# Patient Record
Sex: Female | Born: 1935 | Race: White | Hispanic: No | Smoking: Never smoker
Health system: Southern US, Community
[De-identification: ages and names within clinical notes are randomized; demographics above are authoritative.]

## PROBLEM LIST (undated history)

## (undated) DIAGNOSIS — E039 Hypothyroidism, unspecified: Secondary | ICD-10-CM

## (undated) DIAGNOSIS — K219 Gastro-esophageal reflux disease without esophagitis: Secondary | ICD-10-CM

## (undated) DIAGNOSIS — S32000A Wedge compression fracture of unspecified lumbar vertebra, initial encounter for closed fracture: Secondary | ICD-10-CM

## (undated) DIAGNOSIS — M545 Low back pain, unspecified: Secondary | ICD-10-CM

## (undated) DIAGNOSIS — N183 Chronic kidney disease, stage 3 unspecified: Secondary | ICD-10-CM

## (undated) DIAGNOSIS — G8929 Other chronic pain: Secondary | ICD-10-CM

## (undated) DIAGNOSIS — F419 Anxiety disorder, unspecified: Secondary | ICD-10-CM

## (undated) DIAGNOSIS — Z950 Presence of cardiac pacemaker: Secondary | ICD-10-CM

## (undated) DIAGNOSIS — M1990 Unspecified osteoarthritis, unspecified site: Secondary | ICD-10-CM

## (undated) DIAGNOSIS — F32A Depression, unspecified: Secondary | ICD-10-CM

## (undated) DIAGNOSIS — Z8489 Family history of other specified conditions: Secondary | ICD-10-CM

## (undated) DIAGNOSIS — E079 Disorder of thyroid, unspecified: Secondary | ICD-10-CM

## (undated) DIAGNOSIS — C4491 Basal cell carcinoma of skin, unspecified: Secondary | ICD-10-CM

## (undated) DIAGNOSIS — I4891 Unspecified atrial fibrillation: Secondary | ICD-10-CM

## (undated) DIAGNOSIS — H3530 Unspecified macular degeneration: Secondary | ICD-10-CM

## (undated) DIAGNOSIS — Z9289 Personal history of other medical treatment: Secondary | ICD-10-CM

## (undated) DIAGNOSIS — M810 Age-related osteoporosis without current pathological fracture: Secondary | ICD-10-CM

## (undated) DIAGNOSIS — F329 Major depressive disorder, single episode, unspecified: Secondary | ICD-10-CM

## (undated) DIAGNOSIS — I10 Essential (primary) hypertension: Secondary | ICD-10-CM

## (undated) HISTORY — PX: CARDIAC CATHETERIZATION: SHX172

## (undated) HISTORY — PX: BASAL CELL CARCINOMA EXCISION: SHX1214

## (undated) HISTORY — PX: INSERT / REPLACE / REMOVE PACEMAKER: SUR710

## (undated) HISTORY — PX: ABDOMINAL EXPLORATION SURGERY: SHX538

## (undated) HISTORY — PX: PACEMAKER REMOVAL: SHX5066

## (undated) HISTORY — PX: CATARACT EXTRACTION W/ INTRAOCULAR LENS  IMPLANT, BILATERAL: SHX1307

## (undated) HISTORY — PX: ATRIAL ABLATION SURGERY: SHX560

## (undated) HISTORY — PX: APPENDECTOMY: SHX54

---

## 1978-08-06 HISTORY — PX: ABDOMINAL HYSTERECTOMY: SHX81

## 2012-04-09 ENCOUNTER — Emergency Department (HOSPITAL_BASED_OUTPATIENT_CLINIC_OR_DEPARTMENT_OTHER)
Admission: EM | Admit: 2012-04-09 | Discharge: 2012-04-09 | Disposition: A | Discharge status: Home / Self Care | Payer: Medicare Other | Attending: Emergency Medicine | Admitting: Emergency Medicine

## 2012-04-09 ENCOUNTER — Emergency Department (HOSPITAL_BASED_OUTPATIENT_CLINIC_OR_DEPARTMENT_OTHER): Payer: Medicare Other

## 2012-04-09 ENCOUNTER — Encounter (HOSPITAL_BASED_OUTPATIENT_CLINIC_OR_DEPARTMENT_OTHER): Payer: Self-pay | Admitting: Family Medicine

## 2012-04-09 DIAGNOSIS — M545 Low back pain, unspecified: Secondary | ICD-10-CM | POA: Insufficient documentation

## 2012-04-09 DIAGNOSIS — I10 Essential (primary) hypertension: Secondary | ICD-10-CM | POA: Insufficient documentation

## 2012-04-09 DIAGNOSIS — E039 Hypothyroidism, unspecified: Secondary | ICD-10-CM | POA: Insufficient documentation

## 2012-04-09 DIAGNOSIS — M25559 Pain in unspecified hip: Secondary | ICD-10-CM | POA: Insufficient documentation

## 2012-04-09 DIAGNOSIS — K7689 Other specified diseases of liver: Secondary | ICD-10-CM | POA: Insufficient documentation

## 2012-04-09 DIAGNOSIS — M899 Disorder of bone, unspecified: Secondary | ICD-10-CM | POA: Insufficient documentation

## 2012-04-09 DIAGNOSIS — X500XXA Overexertion from strenuous movement or load, initial encounter: Secondary | ICD-10-CM | POA: Insufficient documentation

## 2012-04-09 DIAGNOSIS — S32009A Unspecified fracture of unspecified lumbar vertebra, initial encounter for closed fracture: Secondary | ICD-10-CM | POA: Insufficient documentation

## 2012-04-09 DIAGNOSIS — S32000A Wedge compression fracture of unspecified lumbar vertebra, initial encounter for closed fracture: Secondary | ICD-10-CM

## 2012-04-09 HISTORY — DX: Disorder of thyroid, unspecified: E07.9

## 2012-04-09 HISTORY — DX: Essential (primary) hypertension: I10

## 2012-04-09 MED ORDER — ONDANSETRON 4 MG PO TBDP
ORAL_TABLET | ORAL | Status: AC
  Filled 2012-04-09: qty 1

## 2012-04-09 MED ORDER — OXYCODONE-ACETAMINOPHEN 5-325 MG PO TABS: 2.0000 | ORAL_TABLET | ORAL | Status: AC | PRN

## 2012-04-09 MED ORDER — ONDANSETRON 4 MG PO TBDP
4.0000 mg | ORAL_TABLET | Freq: Once | ORAL | Status: AC
  Administered 2012-04-09: 4 mg via ORAL

## 2012-04-09 MED ORDER — HYDROCODONE-ACETAMINOPHEN 5-325 MG PO TABS
1.0000 | ORAL_TABLET | Freq: Once | ORAL | Status: AC
  Administered 2012-04-09: 1 via ORAL
  Filled 2012-04-09: qty 1

## 2012-04-09 NOTE — ED Provider Notes (Signed)
History     CSN: [id number]  Arrival date & time 04/09/12  1043   First MD Initiated Contact with Patient 04/09/12 1126      Chief Complaint  Patient presents with  . Back Pain    (Consider location/radiation/quality/duration/timing/severity/associated sxs/prior treatment) HPI Comments: Patient presents with low back pain that started after she was putting a watermelon interior refrigerator to swelling. She had lower herself to the floor on her knees as her back started hurting. The pain is across her low back and radiating down to her bilateral hips. She reports no previous history of back problems. Denies any weakness, numbness or tingling. No fever, vomiting, bowel or bladder incontinence. She is able to ambulate. She denies abdominal pain, chest pain or shortness of breath. He took ibuprofen without relief.  The history is provided by the patient and a relative.    Past Medical History  Diagnosis Date  . Hypertension   . Thyroid disease     Past Surgical History  Procedure Date  . Pacemaker insertion   . Pacemaker removal   . Atrial ablation surgery   . Eye surgery   . Cataract extraction     No family history on file.  History  Substance Use Topics  . Smoking status: Never Smoker   . Smokeless tobacco: Not on file  . Alcohol Use: No    OB History    Grav Para Term Preterm Abortions TAB SAB Ect Mult Living                  Review of Systems  Constitutional: Negative for fever, activity change and appetite change.  HENT: Negative for congestion and rhinorrhea.   Respiratory: Negative for cough, chest tightness and shortness of breath.   Cardiovascular: Negative for chest pain.  Gastrointestinal: Negative for nausea, vomiting and abdominal pain.  Genitourinary: Negative for dysuria and hematuria.  Musculoskeletal: Positive for back pain. Negative for gait problem.  Skin: Negative for rash.  Neurological: Negative for dizziness, weakness and headaches.     Allergies  Adhesive  Home Medications   Current Outpatient Rx  Name Route Sig Dispense Refill  . AMIODARONE HCL PO Oral Take 100 mg by mouth.    . AMLODIPINE BESYLATE PO Oral Take by mouth.    Marland Kitchen GABAPENTIN 100 MG PO CAPS Oral Take 200 mg by mouth at bedtime.    Marland Kitchen HYDROCHLOROTHIAZIDE 25 MG PO TABS Oral Take by mouth daily. Unknown dose    . LEVOTHYROXINE SODIUM 50 MCG PO TABS Oral Take 50 mcg by mouth daily.      BP 139/82  Pulse 63  Temp 98.3 F (36.8 C) (Oral)  Resp 16  Ht 5' 8.5" (1.74 m)  Wt 148 lb (67.132 kg)  BMI 22.18 kg/m2  SpO2 96%  Physical Exam  Constitutional: She is oriented to person, place, and time. She appears well-developed and well-nourished. No distress.  HENT:  Head: Normocephalic and atraumatic.  Mouth/Throat: Oropharynx is clear and moist. No oropharyngeal exudate.  Eyes: Conjunctivae and EOM are normal.  Neck: Normal range of motion. Neck supple.  Cardiovascular: Normal rate, regular rhythm and normal heart sounds.   No murmur heard. Pulmonary/Chest: Effort normal and breath sounds normal. No respiratory distress.  Abdominal: Soft. There is no tenderness. There is no rebound and no guarding.  Musculoskeletal: Normal range of motion. She exhibits no edema and no tenderness.       Diffuse paraspinal lumbar tenderness. No midline tenderness. Full range of motion  of the hips without pain. SLR test negative   Neurological: She is alert and oriented to person, place, and time. No cranial nerve deficit.       5/5 strength in the lower extremities, great toe extension intact. +2 DP and PT pulses.  Unable to illicit patellar reflexes bilaterally.  Skin: Skin is warm.    ED Course  Procedures (including critical care time)  Labs Reviewed - No data to display Dg Lumbar Spine Complete  04/09/2012  *RADIOLOGY REPORT*  Clinical Data: Injured bending  LUMBAR SPINE - COMPLETE 4+ VIEW  Comparison: None.  Findings: The bones are diffusely osteopenic.  There  is a partial compression deformity of the superior endplate of L1 vertebral body of 25-30%.  No retropulsion is seen.  The remainder of intervertebral disc spaces appear normal.  The SI joints are corticated.  Probable renovascular calcification is noted on the right.  IMPRESSION:  1.  Mild compression deformity of the anterior superior aspect of L1 vertebral body. 2.  Diffuse osteopenia.   Original Report Authenticated By: Juline Patch, M.D.    Dg Pelvis 1-2 Views  04/09/2012  *RADIOLOGY REPORT*  Clinical Data: Injured bending  PELVIS - 1-2 VIEW  Comparison: None.  Findings: Both hips are normal position.  The pelvic rami appear normal.  The SI joints appear corticated.  IMPRESSION: Negative.   Original Report Authenticated By: Juline Patch, M.D.    Ct Lumbar Spine Wo Contrast  04/09/2012  *RADIOLOGY REPORT*  Clinical Data: Injury today with low back and bilateral hip pain. L1 compression fracture by plain films.  CT LUMBAR SPINE WITHOUT CONTRAST  Technique:  Multidetector CT imaging of the lumbar spine was performed without intravenous contrast administration. Multiplanar CT image reconstructions were also generated.  Comparison: Plain films lumbar spine 04/09/2012 at 11:56 a.m.  Findings: As seen on the comparison plain films, the patient has a superior endplate compression fracture L1 with approximately 25% vertebral body height loss and slight bony retropulsion.  The fracture margins appear sharp suggesting acute injury.  There is no extension into the posterior elements.  Vertebral body height is otherwise maintained.  Imaged paraspinous structures demonstrate low attenuation lesions in the liver which cannot be fully characterized.  Some of these are consistent with cysts but one in the posterior right hepatic lobe has Hounsfield unit measurements of approximately 35.  There appear to be bilateral parapelvic renal cysts.  Imaged intra- abdominal contents also demonstrate scattered atherosclerosis.   T11-12:  Facet degenerative disease. Otherwise negative.  T12-L1:  Mild bony retropulsion without central canal or foraminal stenosis.  There is some facet arthropathy.  L1-2:  Negative.  L2-3:  Mild disc bulge and facet degenerative disease without central canal or foraminal stenosis.  L3-4:  Disc bulge and ligamentum flavum thickening cause mild central canal narrowing.  Foramina appear open.  L4-5:  Disc bulge and ligamentum flavum thickening cause mild central canal narrowing.  Foramina are open.  There is facet arthropathy.  L5-S1:  Mild disc bulge and ligament flavum thickening without central canal or foraminal stenosis.  IMPRESSION:  1.  Mild superior endplate compression fracture of L1 with minimal bony retropulsion and no extension into the posterior elements appears acute.  Although the fracture cannot be definitively characterized, its appearance is most in keeping with a senile osteoporotic injury. 2.  Low attenuation lesions in the liver are likely cysts but cannot be definitively characterized.  MRI of the abdomen with and without contrast is recommended for  further evaluation. 3.  Bilateral renal cysts.   Original Report Authenticated By: Bernadene Bell. Maricela Curet, M.D.    US Aorta  04/09/2012  *RADIOLOGY REPORT*  Clinical Data:  Back pain.  ULTRASOUND OF ABDOMINAL AORTA  Technique:  Ultrasound examination of the abdominal aorta was performed to evaluate for abdominal aortic aneurysm.  Comparison: None  Abdominal Aorta:  No aneurysm identified.        Maximum AP diameter:  2.9 cm.       Maximum TRV diameter:  2.7 cm.  Right common iliac artery:  Maximum diameter:  1.0 cm. Left common iliac artery:  Maximum diameter:  1.1 cm.  IMPRESSION: No abdominal aortic aneurysm identified.   Original Report Authenticated By: P. Loralie Champagne, M.D.      No diagnosis found.    MDM  Low back pain and hip pain after putting watermelon in refrigerator. No neurological deficits.  Imaging remarkable for  apparent acute L1 compression fracture. Aorta appears normal. Patient ambulatory in the ED without assistance. No focal neurological deficits.  Patient believes her pain is well-controlled and wishes to go home. Treat pain followup with orthopedics. She is made aware of the liver cysts and recommendations for MRI for her PCP.      Glynn Octave, MD 04/09/12 [phone]

## 2012-04-09 NOTE — ED Notes (Signed)
Pt sts she was putting a heavy watermelon into refrigerator at home and low back and hips started hurting and she "lowered" herself to the floor. Pt able to ambulate with pain. Pt took ibuprofen at home.

## 2012-04-09 NOTE — ED Notes (Signed)
Pt with slow steady gait and RN assist to car-pt given xray copies on CD due to plan to f/u with [facility]

## 2015-12-10 ENCOUNTER — Encounter (HOSPITAL_BASED_OUTPATIENT_CLINIC_OR_DEPARTMENT_OTHER): Payer: Self-pay | Admitting: Emergency Medicine

## 2015-12-10 ENCOUNTER — Emergency Department (HOSPITAL_BASED_OUTPATIENT_CLINIC_OR_DEPARTMENT_OTHER)
Admission: EM | Admit: 2015-12-10 | Discharge: 2015-12-10 | Disposition: A | Discharge status: Home / Self Care | Payer: Medicare Other | Attending: Emergency Medicine | Admitting: Emergency Medicine

## 2015-12-10 ENCOUNTER — Emergency Department (HOSPITAL_BASED_OUTPATIENT_CLINIC_OR_DEPARTMENT_OTHER): Payer: Medicare Other

## 2015-12-10 DIAGNOSIS — I10 Essential (primary) hypertension: Secondary | ICD-10-CM | POA: Insufficient documentation

## 2015-12-10 DIAGNOSIS — W228XXA Striking against or struck by other objects, initial encounter: Secondary | ICD-10-CM | POA: Diagnosis not present

## 2015-12-10 DIAGNOSIS — Y939 Activity, unspecified: Secondary | ICD-10-CM | POA: Insufficient documentation

## 2015-12-10 DIAGNOSIS — E079 Disorder of thyroid, unspecified: Secondary | ICD-10-CM | POA: Diagnosis not present

## 2015-12-10 DIAGNOSIS — S20219A Contusion of unspecified front wall of thorax, initial encounter: Secondary | ICD-10-CM

## 2015-12-10 DIAGNOSIS — Y929 Unspecified place or not applicable: Secondary | ICD-10-CM | POA: Diagnosis not present

## 2015-12-10 DIAGNOSIS — Y999 Unspecified external cause status: Secondary | ICD-10-CM | POA: Insufficient documentation

## 2015-12-10 DIAGNOSIS — S2020XA Contusion of thorax, unspecified, initial encounter: Secondary | ICD-10-CM | POA: Insufficient documentation

## 2015-12-10 DIAGNOSIS — S299XXA Unspecified injury of thorax, initial encounter: Secondary | ICD-10-CM | POA: Diagnosis present

## 2015-12-10 NOTE — ED Notes (Signed)
Pt alert, NAD, calm, interactive, resps e/u, speaking in clear complete sentences, no dyspnea noted, family at [facility], c/o mid chest wall pain where shower door hit her, no obvious markings, skin intact, (denies: sob, nv, dizziness or other sx), son at [facility], pt to xray.

## 2015-12-10 NOTE — ED Provider Notes (Signed)
CSN: [id number]     Arrival date & time 12/10/15  1937 History  By signing my name below, I, [facility], attest that this documentation has been prepared under the direction and in the presence of Tanna Furry, MD. Electronically Signed: Virgel Bouquet, ED Scribe. 12/10/2015. 8:42 PM.   Chief Complaint  Patient presents with  . Chest Injury   The history is provided by the patient. No language interpreter was used.   HPI Comments: Deanna Gonzales is a 82 y.o. female with an hx of HTN, thyroid disease, and osteoporosis who presents to the Emergency Department complaining of constant, mild, gradually worsening chest wall pain after an injury that occurred 2 days ago. Pt states that she was standing outside of her glass and metal shower door when it came off the track and fell forward striking her in the forehead and central chest, followed immediately by pain. She reports mild dyspnea and contusion to her forehead. She has taken hydrocodone which provided some relief but no relief today. Per husband and pt, she was prescribed hydrocodone after she was diagnosed with pinched nerves in her shoulder. She takes coumadin and HTN medications regularly. Denies LOC, ecchymosis, falls, or any other symptoms currently.  Past Medical History  Diagnosis Date  . Hypertension   . Thyroid disease    Past Surgical History  Procedure Laterality Date  . Pacemaker insertion    . Pacemaker removal    . Atrial ablation surgery    . Eye surgery    . Cataract extraction     History reviewed. No pertinent family history. Social History  Substance Use Topics  . Smoking status: Never Smoker   . Smokeless tobacco: None  . Alcohol Use: No   OB History    No data available     Review of Systems  Constitutional: Negative for fever, chills, diaphoresis, appetite change and fatigue.  HENT: Negative for mouth sores, sore throat and trouble swallowing.   Eyes: Negative for visual disturbance.   Respiratory: Negative for cough, chest tightness, shortness of breath and wheezing.   Cardiovascular: Negative for chest pain.  Gastrointestinal: Negative for nausea, vomiting, abdominal pain, diarrhea and abdominal distention.  Endocrine: Negative for polydipsia, polyphagia and polyuria.  Genitourinary: Negative for dysuria, frequency and hematuria.  Musculoskeletal: Positive for myalgias (chest wall (central)). Negative for gait problem.  Skin: Positive for wound (contusion). Negative for color change, pallor and rash.  Neurological: Negative for dizziness, light-headedness and headaches.  Hematological: Does not bruise/bleed easily.  Psychiatric/Behavioral: Negative for behavioral problems and confusion.    Allergies  Adhesive  Home Medications   Prior to Admission medications   Medication Sig Start Date End Date Taking? Authorizing Provider  AMIODARONE HCL PO Take 100 mg by mouth.   Yes Historical Provider, MD  AMLODIPINE BESYLATE PO Take by mouth.   Yes Historical Provider, MD  gabapentin (NEURONTIN) 100 MG capsule Take 200 mg by mouth at bedtime.   Yes Historical Provider, MD  hydrochlorothiazide (HYDRODIURIL) 25 MG tablet Take by mouth daily. Unknown dose   Yes Historical Provider, MD  levothyroxine (SYNTHROID, LEVOTHROID) 50 MCG tablet Take 50 mcg by mouth daily.   Yes Historical Provider, MD   BP 117/56 mmHg  Pulse 59  Temp(Src) 97.6 F (36.4 C) (Oral)  Resp 20  Ht 5\' 8"  (1.727 m)  Wt 154 lb (69.854 kg)  BMI 23.42 kg/m2  SpO2 94% Physical Exam  Constitutional: She is oriented to person, place, and time. She appears well-developed and  well-nourished. No distress.  HENT:  Head: Normocephalic. Head is with contusion.  Evolving contusion on the right forehead.   Eyes: Conjunctivae are normal. Pupils are equal, round, and reactive to light. No scleral icterus.  Neck: Normal range of motion. Neck supple. No thyromegaly present.  Cardiovascular: Normal rate and regular  rhythm.  Exam reveals no gallop and no friction rub.   No murmur heard. Pulmonary/Chest: Effort normal and breath sounds normal. No respiratory distress. She has no wheezes. She has no rales. She exhibits tenderness. She exhibits no crepitus.  Tenderness over the mid-sternum. No crepitus  Abdominal: Soft. Bowel sounds are normal. She exhibits no distension. There is no tenderness. There is no rebound.  Musculoskeletal: Normal range of motion.  Neurological: She is alert and oriented to person, place, and time.  Skin: Skin is warm and dry. No rash noted.  Psychiatric: She has a normal mood and affect. Her behavior is normal.    ED Course  Procedures   DIAGNOSTIC STUDIES: Oxygen Saturation is 94% on RA, adequate by my interpretation.    COORDINATION OF CARE: 8:03 PM Will order chest x-ray. Discussed treatment plan with pt at bedside and pt agreed to plan.  8:41 PM Returned to discuss chest imaging results. Will discharge home.  Imaging Review Dg Chest 2 View  12/10/2015  CLINICAL DATA:  82 year old female with chest pain following injury 2 days ago. EXAM: CHEST  2 VIEW COMPARISON:  04/01/2015 and prior radiographs FINDINGS: Cardiomegaly and right-sided pacemaker again noted. Subsegmental atelectasis/scarring within the right lower lobe identified. There is no evidence of focal airspace disease, pulmonary edema, suspicious pulmonary nodule/mass, pleural effusion, or pneumothorax. No acute bony abnormalities are identified. IMPRESSION: Right lower lobe subsegmental atelectasis/scarring. Cardiomegaly. Electronically Signed   By: Margarette Canada M.D.   On: 12/10/2015 20:31   I have personally reviewed and evaluated these images as part of my medical decision-making.   MDM   Final diagnoses:  Chest wall contusion, unspecified laterality, initial encounter    Normal exam other than TTP at sternum.  Clear lungs. Normal cxr.   Tanna Furry, MD 12/10/15 2044

## 2015-12-10 NOTE — ED Notes (Signed)
Dr.James into room prior to RN assessment, see MD notes, pending orders.

## 2015-12-10 NOTE — ED Notes (Signed)
PER PT REPORT WAS HIT IN THE CHEST WITH SHOWER DOOR ON Thursday,increase in chest pain.

## 2015-12-10 NOTE — ED Notes (Signed)
EDP in to room at time of d/c

## 2015-12-10 NOTE — Discharge Instructions (Signed)
Usual hydrocodone, OR Tylenol every 4 hours as needed for pain.   Contusion A contusion is a deep bruise. Contusions happen when an injury causes bleeding under the skin. Symptoms of bruising include pain, swelling, and discolored skin. The skin may turn blue, purple, or yellow. HOME CARE   Rest the injured area.  If told, put ice on the injured area.  Put ice in a plastic bag.  Place a towel between your skin and the bag.  Leave the ice on for 20 minutes, 2-3 times per day.  If told, put light pressure (compression) on the injured area using an elastic bandage. Make sure the bandage is not too tight. Remove it and put it back on as told by your doctor.  If possible, raise (elevate) the injured area above the level of your heart while you are sitting or lying down.  Take over-the-counter and prescription medicines only as told by your doctor. GET HELP IF:  Your symptoms do not get better after several days of treatment.  Your symptoms get worse.  You have trouble moving the injured area. GET HELP RIGHT AWAY IF:   You have very bad pain.  You have a loss of feeling (numbness) in a hand or foot.  Your hand or foot turns pale or cold.   This information is not intended to replace advice given to you by your health care provider. Make sure you discuss any questions you have with your health care provider.   Document Released: 01/09/2008 Document Revised: 04/13/2015 Document Reviewed: 12/08/2014 Elsevier Interactive Patient Education [employer].

## 2017-09-06 DIAGNOSIS — S32000A Wedge compression fracture of unspecified lumbar vertebra, initial encounter for closed fracture: Secondary | ICD-10-CM

## 2017-09-06 HISTORY — DX: Wedge compression fracture of unspecified lumbar vertebra, initial encounter for closed fracture: S32.000A

## 2017-09-12 ENCOUNTER — Other Ambulatory Visit: Payer: Self-pay | Admitting: Family Medicine

## 2017-09-12 ENCOUNTER — Ambulatory Visit
Admission: RE | Admit: 2017-09-12 | Discharge: 2017-09-12 | Disposition: A | Discharge status: Home / Self Care | Payer: Medicare Other | Source: Ambulatory Visit | Attending: Family Medicine | Admitting: Family Medicine

## 2017-09-12 DIAGNOSIS — M545 Low back pain, unspecified: Secondary | ICD-10-CM

## 2017-09-15 ENCOUNTER — Other Ambulatory Visit: Payer: Self-pay | Admitting: Family Medicine

## 2017-09-15 DIAGNOSIS — M549 Dorsalgia, unspecified: Secondary | ICD-10-CM

## 2017-09-18 ENCOUNTER — Inpatient Hospital Stay (HOSPITAL_COMMUNITY)
Admission: EM | Admit: 2017-09-18 | Discharge: 2017-09-26 | DRG: 517 | Disposition: A | Discharge status: Skilled Nursing Facility | Payer: Medicare Other | Attending: Family Medicine | Admitting: Family Medicine

## 2017-09-18 ENCOUNTER — Encounter (HOSPITAL_COMMUNITY): Payer: Self-pay | Admitting: Emergency Medicine

## 2017-09-18 ENCOUNTER — Other Ambulatory Visit: Payer: Self-pay

## 2017-09-18 DIAGNOSIS — M4856XA Collapsed vertebra, not elsewhere classified, lumbar region, initial encounter for fracture: Secondary | ICD-10-CM | POA: Diagnosis present

## 2017-09-18 DIAGNOSIS — Z950 Presence of cardiac pacemaker: Secondary | ICD-10-CM

## 2017-09-18 DIAGNOSIS — M545 Low back pain, unspecified: Secondary | ICD-10-CM

## 2017-09-18 DIAGNOSIS — E039 Hypothyroidism, unspecified: Secondary | ICD-10-CM | POA: Diagnosis present

## 2017-09-18 DIAGNOSIS — Z91048 Other nonmedicinal substance allergy status: Secondary | ICD-10-CM

## 2017-09-18 DIAGNOSIS — Y92230 Patient room in hospital as the place of occurrence of the external cause: Secondary | ICD-10-CM | POA: Diagnosis not present

## 2017-09-18 DIAGNOSIS — Z7901 Long term (current) use of anticoagulants: Secondary | ICD-10-CM | POA: Diagnosis not present

## 2017-09-18 DIAGNOSIS — I447 Left bundle-branch block, unspecified: Secondary | ICD-10-CM | POA: Diagnosis present

## 2017-09-18 DIAGNOSIS — I4891 Unspecified atrial fibrillation: Secondary | ICD-10-CM

## 2017-09-18 DIAGNOSIS — R5381 Other malaise: Secondary | ICD-10-CM | POA: Diagnosis present

## 2017-09-18 DIAGNOSIS — Z79899 Other long term (current) drug therapy: Secondary | ICD-10-CM

## 2017-09-18 DIAGNOSIS — R159 Full incontinence of feces: Secondary | ICD-10-CM | POA: Diagnosis present

## 2017-09-18 DIAGNOSIS — N183 Chronic kidney disease, stage 3 (moderate): Secondary | ICD-10-CM | POA: Diagnosis present

## 2017-09-18 DIAGNOSIS — H3530 Unspecified macular degeneration: Secondary | ICD-10-CM | POA: Diagnosis present

## 2017-09-18 DIAGNOSIS — I129 Hypertensive chronic kidney disease with stage 1 through stage 4 chronic kidney disease, or unspecified chronic kidney disease: Secondary | ICD-10-CM | POA: Diagnosis present

## 2017-09-18 DIAGNOSIS — Z90710 Acquired absence of both cervix and uterus: Secondary | ICD-10-CM | POA: Diagnosis not present

## 2017-09-18 DIAGNOSIS — Z9849 Cataract extraction status, unspecified eye: Secondary | ICD-10-CM | POA: Diagnosis not present

## 2017-09-18 DIAGNOSIS — M5440 Lumbago with sciatica, unspecified side: Secondary | ICD-10-CM | POA: Diagnosis present

## 2017-09-18 DIAGNOSIS — M62830 Muscle spasm of back: Secondary | ICD-10-CM | POA: Diagnosis present

## 2017-09-18 DIAGNOSIS — Z85828 Personal history of other malignant neoplasm of skin: Secondary | ICD-10-CM

## 2017-09-18 DIAGNOSIS — S32040D Wedge compression fracture of fourth lumbar vertebra, subsequent encounter for fracture with routine healing: Secondary | ICD-10-CM | POA: Diagnosis not present

## 2017-09-18 DIAGNOSIS — K5900 Constipation, unspecified: Secondary | ICD-10-CM | POA: Diagnosis not present

## 2017-09-18 DIAGNOSIS — R112 Nausea with vomiting, unspecified: Secondary | ICD-10-CM | POA: Diagnosis not present

## 2017-09-18 DIAGNOSIS — Z66 Do not resuscitate: Secondary | ICD-10-CM | POA: Diagnosis present

## 2017-09-18 DIAGNOSIS — T50995A Adverse effect of other drugs, medicaments and biological substances, initial encounter: Secondary | ICD-10-CM | POA: Diagnosis not present

## 2017-09-18 DIAGNOSIS — F419 Anxiety disorder, unspecified: Secondary | ICD-10-CM

## 2017-09-18 DIAGNOSIS — Z419 Encounter for procedure for purposes other than remedying health state, unspecified: Secondary | ICD-10-CM

## 2017-09-18 DIAGNOSIS — S32000A Wedge compression fracture of unspecified lumbar vertebra, initial encounter for closed fracture: Secondary | ICD-10-CM | POA: Diagnosis present

## 2017-09-18 DIAGNOSIS — Z79890 Hormone replacement therapy (postmenopausal): Secondary | ICD-10-CM

## 2017-09-18 DIAGNOSIS — D72829 Elevated white blood cell count, unspecified: Secondary | ICD-10-CM | POA: Diagnosis present

## 2017-09-18 DIAGNOSIS — K219 Gastro-esophageal reflux disease without esophagitis: Secondary | ICD-10-CM | POA: Diagnosis present

## 2017-09-18 DIAGNOSIS — F418 Other specified anxiety disorders: Secondary | ICD-10-CM | POA: Diagnosis present

## 2017-09-18 DIAGNOSIS — Z888 Allergy status to other drugs, medicaments and biological substances status: Secondary | ICD-10-CM | POA: Diagnosis not present

## 2017-09-18 DIAGNOSIS — I480 Paroxysmal atrial fibrillation: Secondary | ICD-10-CM | POA: Diagnosis present

## 2017-09-18 HISTORY — DX: Low back pain, unspecified: M54.50

## 2017-09-18 HISTORY — DX: Low back pain: M54.5

## 2017-09-18 HISTORY — DX: Unspecified osteoarthritis, unspecified site: M19.90

## 2017-09-18 HISTORY — DX: Chronic kidney disease, stage 3 (moderate): N18.3

## 2017-09-18 HISTORY — DX: Family history of other specified conditions: Z84.89

## 2017-09-18 HISTORY — DX: Age-related osteoporosis without current pathological fracture: M81.0

## 2017-09-18 HISTORY — DX: Major depressive disorder, single episode, unspecified: F32.9

## 2017-09-18 HISTORY — DX: Basal cell carcinoma of skin, unspecified: C44.91

## 2017-09-18 HISTORY — DX: Personal history of other medical treatment: Z92.89

## 2017-09-18 HISTORY — DX: Other chronic pain: G89.29

## 2017-09-18 HISTORY — DX: Presence of cardiac pacemaker: Z95.0

## 2017-09-18 HISTORY — DX: Unspecified macular degeneration: H35.30

## 2017-09-18 HISTORY — DX: Anxiety disorder, unspecified: F41.9

## 2017-09-18 HISTORY — DX: Chronic kidney disease, stage 3 unspecified: N18.30

## 2017-09-18 HISTORY — DX: Unspecified atrial fibrillation: I48.91

## 2017-09-18 HISTORY — DX: Gastro-esophageal reflux disease without esophagitis: K21.9

## 2017-09-18 HISTORY — DX: Depression, unspecified: F32.A

## 2017-09-18 HISTORY — DX: Hypothyroidism, unspecified: E03.9

## 2017-09-18 HISTORY — DX: Wedge compression fracture of unspecified lumbar vertebra, initial encounter for closed fracture: S32.000A

## 2017-09-18 LAB — CBC WITH DIFFERENTIAL/PLATELET
Basophils Absolute: 0 10*3/uL (ref 0.0–0.1)
Basophils Relative: 0 %
EOS ABS: 0.1 10*3/uL (ref 0.0–0.7)
EOS PCT: 1 %
HCT: 44 % (ref 36.0–46.0)
Hemoglobin: 14.4 g/dL (ref 12.0–15.0)
LYMPHS ABS: 1.7 10*3/uL (ref 0.7–4.0)
LYMPHS PCT: 13 %
MCH: 32.7 pg (ref 26.0–34.0)
MCHC: 32.7 g/dL (ref 30.0–36.0)
MCV: 100 fL (ref 78.0–100.0)
MONO ABS: 1.1 10*3/uL — AB (ref 0.1–1.0)
Monocytes Relative: 9 %
Neutro Abs: 9.7 10*3/uL — ABNORMAL HIGH (ref 1.7–7.7)
Neutrophils Relative %: 77 %
PLATELETS: 309 10*3/uL (ref 150–400)
RBC: 4.4 MIL/uL (ref 3.87–5.11)
RDW: 14.1 % (ref 11.5–15.5)
WBC: 12.5 10*3/uL — AB (ref 4.0–10.5)

## 2017-09-18 LAB — URINALYSIS, ROUTINE W REFLEX MICROSCOPIC
Bilirubin Urine: NEGATIVE
Glucose, UA: NEGATIVE mg/dL
HGB URINE DIPSTICK: NEGATIVE
Ketones, ur: NEGATIVE mg/dL
Leukocytes, UA: NEGATIVE
NITRITE: NEGATIVE
PROTEIN: NEGATIVE mg/dL
SPECIFIC GRAVITY, URINE: 1.005 (ref 1.005–1.030)
pH: 9 — ABNORMAL HIGH (ref 5.0–8.0)

## 2017-09-18 LAB — I-STAT CHEM 8, ED
BUN: 24 mg/dL — ABNORMAL HIGH (ref 6–20)
Calcium, Ion: 1.18 mmol/L (ref 1.15–1.40)
Chloride: 104 mmol/L (ref 101–111)
Creatinine, Ser: 1.2 mg/dL — ABNORMAL HIGH (ref 0.44–1.00)
GLUCOSE: 81 mg/dL (ref 65–99)
HCT: 44 % (ref 36.0–46.0)
Hemoglobin: 15 g/dL (ref 12.0–15.0)
Potassium: 3.9 mmol/L (ref 3.5–5.1)
SODIUM: 143 mmol/L (ref 135–145)
TCO2: 27 mmol/L (ref 22–32)

## 2017-09-18 LAB — PROTIME-INR
INR: 2.33
PROTHROMBIN TIME: 25.4 s — AB (ref 11.4–15.2)

## 2017-09-18 MED ORDER — ONDANSETRON HCL 4 MG PO TABS: 4.0000 mg | ORAL_TABLET | Freq: Four times a day (QID) | ORAL | Status: DC | PRN

## 2017-09-18 MED ORDER — ALPRAZOLAM 0.25 MG PO TABS
0.2500 mg | ORAL_TABLET | Freq: Two times a day (BID) | ORAL | Status: DC | PRN
  Administered 2017-09-19 – 2017-09-21 (×3): 0.25 mg via ORAL
  Filled 2017-09-18 (×3): qty 1

## 2017-09-18 MED ORDER — ONDANSETRON HCL 4 MG/2ML IJ SOLN
4.0000 mg | Freq: Four times a day (QID) | INTRAMUSCULAR | Status: DC | PRN
Start: 2017-09-18 — End: 2017-09-26
  Administered 2017-09-19 – 2017-09-20 (×3): 4 mg via INTRAVENOUS
  Filled 2017-09-18 (×3): qty 2

## 2017-09-18 MED ORDER — ACETAMINOPHEN 325 MG PO TABS: 650.0000 mg | ORAL_TABLET | Freq: Four times a day (QID) | ORAL | Status: DC | PRN

## 2017-09-18 MED ORDER — MIRTAZAPINE 15 MG PO TABS
15.0000 mg | ORAL_TABLET | Freq: Every day | ORAL | Status: DC
  Administered 2017-09-18 – 2017-09-25 (×8): 15 mg via ORAL
  Filled 2017-09-18 (×9): qty 1

## 2017-09-18 MED ORDER — LEVOTHYROXINE SODIUM 88 MCG PO TABS
88.0000 ug | ORAL_TABLET | Freq: Every day | ORAL | Status: DC
  Administered 2017-09-19 – 2017-09-20 (×2): 88 ug via ORAL
  Filled 2017-09-18 (×2): qty 1

## 2017-09-18 MED ORDER — SODIUM CHLORIDE 0.9% FLUSH: 3.0000 mL | INTRAVENOUS | Status: DC | PRN

## 2017-09-18 MED ORDER — AMIODARONE HCL 200 MG PO TABS
100.0000 mg | ORAL_TABLET | Freq: Every day | ORAL | Status: DC
  Administered 2017-09-19 – 2017-09-26 (×7): 100 mg via ORAL
  Filled 2017-09-18 (×7): qty 1

## 2017-09-18 MED ORDER — LOSARTAN POTASSIUM 50 MG PO TABS
25.0000 mg | ORAL_TABLET | Freq: Two times a day (BID) | ORAL | Status: DC
  Administered 2017-09-18 – 2017-09-26 (×14): 25 mg via ORAL
  Filled 2017-09-18 (×15): qty 1

## 2017-09-18 MED ORDER — MORPHINE SULFATE (PF) 4 MG/ML IV SOLN
4.0000 mg | Freq: Once | INTRAVENOUS | Status: AC
  Administered 2017-09-18: 4 mg via INTRAVENOUS
  Filled 2017-09-18: qty 1

## 2017-09-18 MED ORDER — TRIAMTERENE-HCTZ 37.5-25 MG PO TABS
1.0000 | ORAL_TABLET | ORAL | Status: DC
  Administered 2017-09-20 – 2017-09-26 (×4): 1 via ORAL
  Filled 2017-09-18 (×5): qty 1

## 2017-09-18 MED ORDER — DILTIAZEM HCL ER COATED BEADS 120 MG PO CP24
120.0000 mg | ORAL_CAPSULE | Freq: Every day | ORAL | Status: DC
  Administered 2017-09-19 – 2017-09-26 (×7): 120 mg via ORAL
  Filled 2017-09-18 (×9): qty 1

## 2017-09-18 MED ORDER — OXYCODONE-ACETAMINOPHEN 7.5-325 MG PO TABS
1.0000 | ORAL_TABLET | Freq: Four times a day (QID) | ORAL | Status: DC | PRN
  Administered 2017-09-18 – 2017-09-19 (×3): 1 via ORAL
  Filled 2017-09-18 (×3): qty 1

## 2017-09-18 MED ORDER — SODIUM CHLORIDE 0.9% FLUSH
3.0000 mL | Freq: Two times a day (BID) | INTRAVENOUS | Status: DC
  Administered 2017-09-18 – 2017-09-26 (×12): 3 mL via INTRAVENOUS

## 2017-09-18 MED ORDER — MORPHINE SULFATE (PF) 4 MG/ML IV SOLN
4.0000 mg | INTRAVENOUS | Status: DC | PRN
  Administered 2017-09-18 – 2017-09-19 (×3): 4 mg via INTRAVENOUS
  Filled 2017-09-18 (×3): qty 1

## 2017-09-18 MED ORDER — SODIUM CHLORIDE 0.9 % IV SOLN: 250.0000 mL | INTRAVENOUS | Status: DC | PRN

## 2017-09-18 MED ORDER — SODIUM CHLORIDE 0.9 % IV BOLUS (SEPSIS)
1000.0000 mL | Freq: Once | INTRAVENOUS | Status: AC
  Administered 2017-09-18: 1000 mL via INTRAVENOUS

## 2017-09-18 MED ORDER — POLYETHYLENE GLYCOL 3350 17 G PO PACK: 17.0000 g | PACK | Freq: Every day | ORAL | Status: DC | PRN

## 2017-09-18 NOTE — ED Notes (Signed)
Patient placed on pure-wick external catheter.

## 2017-09-18 NOTE — ED Provider Notes (Signed)
[facility] Provider Note   CSN: [id number] Arrival date & time: 09/18/17  1037     History   Chief Complaint Chief Complaint  Patient presents with  . Back Pain    HPI Deanna Gonzales is a 84 y.o. female.  HPI   84 year old female with history of osteoporosis, hypertension, thyroid disease presenting for worsening back pain.  Patient report on February 1, as she was sitting on the edge of the bed she felt something snap with acute onset of pain to her lower back.  Pain is sharp, nonradiating, worsening with movement and improves with rest.  Pain has become progressively worse.  She was seen by orthopedist, Dr. Rip Harbour 3 days ago for her pain.  A CT of her lumbar spine was obtained which demonstrates acute L4 vertebral body compression fracture with approximately 40% central height loss, as well as a chronic L1 vertebral body compression fracture with approximately 60% height loss.  Patient was given oxycodone which she has been taking every 3 hours without adequate improvement.  This morning she was unable to get out of bed due to her pain.  While at rest, she rates her pain as 5 out of 10 and pain is severe with movement.  She denies any associated headache, fever, chest pain, shortness of breath, productive cough, dysuria, bowel bladder incontinence or saddle anesthesia.  No radicular leg pain.  She did reach out to her orthopedist who felt patient will need to be admitted for pain management.  She is scheduled to be seen by orthopedist, Dr. Lynann Bologna, on Monday.    Past Medical History:  Diagnosis Date  . Hypertension   . Osteoporosis   . Thyroid disease     There are no active problems to display for this patient.   Past Surgical History:  Procedure Laterality Date  . ATRIAL ABLATION SURGERY    . CATARACT EXTRACTION    . EYE SURGERY    . PACEMAKER INSERTION    . PACEMAKER REMOVAL      OB History    No data available        Home Medications    Prior to Admission medications   Medication Sig Start Date End Date Taking? Authorizing Provider  AMIODARONE HCL PO Take 100 mg by mouth.    [provider]  AMLODIPINE BESYLATE PO Take by mouth.    [provider]  gabapentin (NEURONTIN) 100 MG capsule Take 200 mg by mouth at bedtime.    [provider]  hydrochlorothiazide (HYDRODIURIL) 25 MG tablet Take by mouth daily. Unknown dose    [provider]  levothyroxine (SYNTHROID, LEVOTHROID) 50 MCG tablet Take 50 mcg by mouth daily.    [provider]    Family History No family history on file.  Social History Social History   Tobacco Use  . Smoking status: Never Smoker  Substance Use Topics  . Alcohol use: No  . Drug use: No     Allergies   Adhesive [tape]   Review of Systems Review of Systems  All other systems reviewed and are negative.    Physical Exam Updated Vital Signs BP (!) 161/69 (BP Location: Right Arm)   Pulse 71   Temp 98.2 F (36.8 C) (Oral)   Resp 16   Ht 5\' 8"  (1.727 m)   Wt 70.3 kg (155 lb)   SpO2 95%   BMI 23.57 kg/m   Physical Exam  Constitutional: She is oriented  to person, place, and time. She appears well-developed and well-nourished. No distress.  Elderly female laying in bed nontoxic  HENT:  Head: Atraumatic.  Eyes: Conjunctivae are normal.  Neck: Neck supple.  Cardiovascular: Normal rate and regular rhythm.  Pulmonary/Chest: Effort normal and breath sounds normal.  Abdominal: Soft. Bowel sounds are normal. She exhibits no distension. There is no tenderness.  Musculoskeletal: She exhibits tenderness (Exquisite tenderness to palpation of her lumbar spine most significant at L4-L5 without crepitus or step-off.  No overlying skin changes.).  4 out of 5 strength to bilateral lower extremities with intact distal pulses.  Patellar deep tendon reflexes intact bilaterally.  No foot drops.  Neurological: She is alert  and oriented to person, place, and time.  Skin: No rash noted.  Psychiatric: She has a normal mood and affect.  Nursing note and vitals reviewed.    ED Treatments / Results  Labs (all labs ordered are listed, but only abnormal results are displayed) Labs Reviewed  CBC WITH DIFFERENTIAL/PLATELET - Abnormal; Notable for the following components:      Result Value   WBC 12.5 (*)    Neutro Abs 9.7 (*)    Monocytes Absolute 1.1 (*)    All other components within normal limits  I-STAT CHEM 8, ED - Abnormal; Notable for the following components:   BUN 24 (*)    Creatinine, Ser 1.20 (*)    All other components within normal limits  URINALYSIS, ROUTINE W REFLEX MICROSCOPIC    EKG  EKG Interpretation None     ED ECG REPORT   Date: 09/18/2017  Rate: 70  Rhythm: normal sinus rhythm  QRS Axis: left  Intervals: borderline prolonged PR interval  ST/T Wave abnormalities: nonspecific ST changes  Conduction Disutrbances:left bundle branch block  Narrative Interpretation:   Old EKG Reviewed: unchanged  I have personally reviewed the EKG tracing and agree with the computerized printout as noted.   Radiology No results found.  Procedures Procedures (including critical care time)  Medications Ordered in ED Medications  morphine 4 MG/ML injection 4 mg (4 mg Intravenous Given 09/18/17 1205)  sodium chloride 0.9 % bolus 1,000 mL (1,000 mLs Intravenous New Bag/Given 09/18/17 1309)     Initial Impression / Assessment and Plan / ED Course  I have reviewed the triage vital signs and the nursing notes.  Pertinent labs & imaging results that were available during my care of the patient were reviewed by me and considered in my medical decision making (see chart for details).     BP (!) 167/71   Pulse 69   Temp 98.2 F (36.8 C) (Oral)   Resp 14   Ht 5\' 8"  (1.727 m)   Wt 70.3 kg (155 lb)   SpO2 94%   BMI 23.57 kg/m    Final Clinical Impressions(s) / ED Diagnoses   Final  diagnoses:  Closed compression fracture of L4 lumbar vertebra with routine healing, subsequent encounter  Acute midline low back pain, with sciatica presence unspecified    ED Discharge Orders    None     11:30 AM This is an elderly female here with progressive worsening back pain now unable to ambulate or get out of bed due to her back pain.  She had a recent lumbar CT scan that demonstrated acute compression fracture involving the L4 with 40% height loss.  She does not have any symptoms suggestive of cauda equina.  No history of IV drug use or active cancer.  Will consult for  admission.  11:57 AM Appreciate consultation from orthopedist Dr. Rhona Raider who agrees pt will likely benefit from admission for pain control and likely kyphoplasty as treatment. Will consult with medicine for admission.    1:13 PM Appreciate consultation with Family Medicine resident who agrees to see pt in the ER and will admit for pain control.  Dr. Lynann Bologna will likely be involved in her care while in the hospital.  Pt voice understanding and agrees with plan.    Domenic Moras, PA-C 09/18/17 1411    Domenic Moras, PA-C 09/18/17 1544    Fredia Sorrow, MD 09/22/17 [phone]

## 2017-09-18 NOTE — Progress Notes (Signed)
1600 Received pt from ED, A&O x3. Calm and cooperative.  Back pain noted with movement. Extra linens and lift pads under pt removed, log rolling done. Pt also c/o muscle spasms. Pt's family at bedside.

## 2017-09-18 NOTE — ED Triage Notes (Addendum)
Pt had a recent compression on February 1st unsure which part of back but pain is in lower back. Pt stood up from bed and started having pain, HX of Osteoporosis. Reports  increased pain today. Pt had 50 mcg Fentanyl en route to ED. Pt still ambulates with walker with worst pain during getting up and down.

## 2017-09-18 NOTE — H&P (Signed)
[facility] Admission History and Physical Service Pager: [phone]  Patient name: Deanna Gonzales Medical record number: [id number] Date of birth: 12-May-1928 Age: 84 y.o. Gender: female  Primary Care Provider: Shellia Carwin, PA-C Consultants: orthopedic surgery Code Status: DNR/DNI  Chief Complaint: back pain  Assessment and Plan: Deanna Gonzales is a 84 y.o. female presenting with back pain following a recentlly diagnosed L4 compresion fracture . PMH is significant for A Fib (on warfarin), osteoporosis, hypothyroidism, HTN, and CKD.   Acute Back Pain 2/2 new L4 Compression Fracture: Patient presents with acute nontraumatic onset of lower back pain on 2/1 with subsequent diagnosis of L4 compression fracture with 40% loss vertebral height on CT L spine. Patient endorses some bowel incontinence but denies bladder incontinence and saddle anesthesia. Pain uncontrolled at home on 1/2 tablet percocet 5-325 Q3H. Physical exam with sensation intact and 4/5 strength in b/l LEs. Admission labs remarkable for INR 2.33. Otherwise, no history or other symptoms concerning for malignancy and no focal neurological findings indicative of spinal canal damage. Revised cardiac risk-index for perioperative management of 0pts, representing 3.9% risk of major cardiac event. Plan to admit for pain management and medical optimization for likely kyphoplasty but awaiting Orthopedics recommendations - Admit to [facility], attending Dr. Mingo Amber - Orthopedics consulted in ED - f/u EKG for preop - hold warfarin given likely surgery and monitor INR - f/u AM CBC, PT-INR, BMP - Bed rest with bathroom privleges, f/u with ortho surg reccs regarding mobility restrictions - NPO at midnight - Pain: acetaminophen 650mg  Q6H prn mild pain, percocet 7.5-325 Q6H prn moderate pain, morphine 4mg  Q4H prn severe pain - ondansetron 4mg  prn Q6H prn nausea - PT/OT - VS per unit, I/Os qShift  Leukocytosis: Patient w  WBC 12.5 on admission, otherwise asymptomatic from infectious standpoint (no cough, nl UA, no fevers, VSS). Possibly acute phase reactant vs hemoconcentration.  - AM CBC, CTM  Hypertension: Chronic. BP on admission elevated to 921J systolic, likely due to pain. Continue home meds.  - Triamterene-HCTZ 37.5-25mg  daily - Losartan 25mg  BID  Atrial Fibrillation: Patient in sinus rhythm on presentation and anticoagulated with warfarin (INR 2.33).  - Hold warfarin pending surgery - Continue home amiodarone 100mg  daily - Continue home diltiazem 24hr capsule 120mg  daily - monitor INR  CKD III: On presentation, Cr 1.2 (per care everywhere, baseline 1.5-1.7).  - CTM - Avoid NSAIDS, nephrotoxic agents  Anxiety: Chronic, stable - Continue home alprazolam 0.25mg  BID prn, mirtazapine 15mg  qHS  Hypothyroidism: Chronic, stable. Last TSH 3.5 03/2017.  - Continue home levothyroxine 29mcg daily  FEN/GI: Heart healthy diet, NPO at midnight for likely surgery tomorrow Prophylaxis: none pending likely surgery; SCDs ordered  Disposition: admission to inpatient status  History of Present Illness:  Deanna Gonzales is a 84 y.o. female presenting with lower back pain. She is accompanied by her daugher at bedside who helped provide the history. She reports that she was sitting on edge of bed on 2/1 and stood up when she felt the sudden onset of acute back pain. The pain does not radiate and is worsened with movement. She denied any recent falls, trauma to the area, or MVC. She sought care with orthopedics on Monday 2/4 and reports being diagnosed with an L4 fracture. At the time, she was given oxycodone-acetaminophen 5-325 which helped her pain a little but didn't last long. She also notes that they gave her a steroid shot in her hip which improved pain in her pelvis, however, the pain  in her back persisted despite getting 1/2 tablet of percocet every 3 hours. The pain worsened over the course of the week until the  patient was no longer able to get out of bed due to pain. She previously had used a can occasionally and now is only mobile with a walker.  Patient denies urinary incontinence but had some bowel incontinence today when transferring. She had not had any bowel or bladder incontinence prior to this. She currently endorses the urge to urinate without being able to do so. She last urinated this morning around 9:30.   She lives at home alone with support from her daughter. She is responsible for all ADLs and most IADLs (she pays her own bills, uses the phone, can do her own shopping, but does not drive).   Review Of Systems: Per HPI with the following additions:   Review of Systems  Constitutional: Negative for chills and fever.  Respiratory: Negative for cough and shortness of breath.   Cardiovascular: Negative for chest pain, palpitations and leg swelling.  Gastrointestinal: Negative for abdominal pain, nausea and vomiting.       + stool incontinence  Musculoskeletal: Positive for back pain. Negative for falls.  Neurological: Positive for tremors. Negative for dizziness, focal weakness and headaches.    Past Medical History: Past Medical History:  Diagnosis Date  . Age-related macular degeneration   . Anxiety   . Arthritis   . Atrial fibrillation ([place])   . Chronic kidney disease   . CKD (chronic kidney disease)   . GERD (gastroesophageal reflux disease)   . Hypertension   . Osteoporosis   . Thyroid disease     Past Surgical History: Past Surgical History:  Procedure Laterality Date  . ATRIAL ABLATION SURGERY    . CATARACT EXTRACTION    . EYE SURGERY    . PACEMAKER INSERTION    . PACEMAKER REMOVAL    Still has pacemaker on R side   Social History: Social History   Tobacco Use  . Smoking status: Never Smoker  Substance Use Topics  . Alcohol use: No  . Drug use: No   Additional social history: Lives at home alone. Performs her ADLs, cooks cleans, bathes, pays on bills.   Please also refer to relevant sections of EMR.  Family History: Family History  Problem Relation Age of Onset  . Anxiety disorder Mother   . Heart disease Mother   . Hypertension Father   . Heart disease Father   . Anxiety disorder Sister   . Aneurysm Sister      Allergies and Medications: Allergies  Allergen Reactions  . Flexeril [Cyclobenzaprine] Anaphylaxis  . Vortioxetine Anaphylaxis  . Duloxetine Other (See Comments)    hypertension hypertension   . Lisinopril Other (See Comments)    unknown unknown   . Risedronate Other (See Comments)    arthralgia  . Risedronate Sodium Other (See Comments)    arthralgia arthralgia  . Adhesive [Tape] Rash    Gets red and welts  . Alendronate Nausea Only   No current facility-administered medications on file prior to encounter.    Current Outpatient Medications on File Prior to Encounter  Medication Sig Dispense Refill  . ALPRAZolam (XANAX) 0.25 MG tablet Take 0.25 mg by mouth at bedtime.     Marland Kitchen amiodarone (PACERONE) 100 MG tablet Take 100 mg by mouth daily.     Marland Kitchen diltiazem (CARDIZEM) 120 MG tablet Take 120 mg by mouth daily.    Marland Kitchen gabapentin (NEURONTIN) 100  MG capsule Take 200 mg by mouth 2 (two) times daily.     . hydrochlorothiazide (HYDRODIURIL) 25 MG tablet Take 25 mg by mouth daily. Unknown dose     . HYDROcodone-acetaminophen (NORCO/VICODIN) 5-325 MG tablet Take 1 tablet by mouth 3 (three) times daily as needed for severe pain.    Marland Kitchen levothyroxine (SYNTHROID, LEVOTHROID) 100 MCG tablet Take 100 mcg by mouth daily.    Marland Kitchen losartan (COZAAR) 25 MG tablet Take 25 mg by mouth 2 (two) times daily.    . mirtazapine (REMERON) 15 MG tablet Take 15 mg by mouth at bedtime.    Marland Kitchen oxyCODONE-acetaminophen (PERCOCET/ROXICET) 5-325 MG tablet Take 1 tablet by mouth every 8 (eight) hours as needed for severe pain.    Marland Kitchen warfarin (COUMADIN) 3 MG tablet Take 1.5-3 mg by mouth See admin instructions. Take 1.5mg  on SAT and 3mg  on all other days     Home Med List provided by daughter: Losartan 25mg  BID Xanax 0.5mg  nightly Amiodarone 100mg  qd Dyazide 37.5-25mg  every other day Diltiazem 120mg  qd Levothyroxine 58mcg qd Mirtazapine 15mg  qhs Warfarin 3mg  qd  Objective: BP (!) 164/70 (BP Location: Left Arm)   Pulse 70   Temp 98.4 F (36.9 C) (Oral)   Resp 18   Ht 5\' 8"  (1.727 m)   Wt 155 lb (70.3 kg)   SpO2 94%   BMI 23.57 kg/m  Exam: General: Elderly female, appears stated age, resting comfortably in bed, NAD, A&Ox3 Eyes: PERRLA, no conjunctival icterus ENTM: posterior oropharynx without erythema or exudates Cardiovascular: RRR, ?soft systolic murmur. Respiratory: Lungs CTA b/l, normal WOB on RA Gastrointestinal: Abdomen nontender, normoactive bowel sounds. Well healed midline surgical scar. No appreciable organomegaly.  MSK: Grip strength intact bilaterally. 4/5 strength in bilateral lower extremities.  Derm: no appreciable rashes.  Neuro: Resting tremor of both hands appreciated, worsened with intentional movement. Sensation to light touch intact bilaterally in LEs.  Psych: Oriented x3, affect appropriate. Responds appropriately to questions.   Labs and Imaging: CBC BMET  Recent Labs  Lab 09/18/17 1142 09/18/17 1154  WBC 12.5*  --   HGB 14.4 15.0  HCT 44.0 44.0  PLT 309  --    Recent Labs  Lab 09/18/17 1154  NA 143  K 3.9  CL 104  BUN 24*  CREATININE 1.20*  GLUCOSE 81     INR 2.33  CT abd  09/12/17  IMPRESSION: 1. Acute L4 vertebral body compression fracture with approximately 40% central height loss. 2. Chronic L1 vertebral body compression fracture with approximately 60% height loss.  Dimple Casey, Medical Student 09/18/2017, 5:03 PM MS4, [place] Intern pager: [phone], text pages welcome  FPTS Upper-Level Resident Addendum  I have independently interviewed and examined the patient. I have discussed the above with the original author and agree with their  documentation. Please see also any attending notes.   Bufford Lope, DO PGY-2, [facility] 09/18/2017 5:43 PM  [place] Service pager: [phone] (text pages welcome through [place])

## 2017-09-19 LAB — BASIC METABOLIC PANEL
Anion gap: 12 (ref 5–15)
BUN: 22 mg/dL — ABNORMAL HIGH (ref 6–20)
CALCIUM: 9 mg/dL (ref 8.9–10.3)
CHLORIDE: 105 mmol/L (ref 101–111)
CO2: 24 mmol/L (ref 22–32)
CREATININE: 1.31 mg/dL — AB (ref 0.44–1.00)
GFR calc Af Amer: 41 mL/min — ABNORMAL LOW (ref >60)
GFR calc non Af Amer: 35 mL/min — ABNORMAL LOW (ref >60)
Glucose, Bld: 85 mg/dL (ref 65–99)
Potassium: 3.8 mmol/L (ref 3.5–5.1)
Sodium: 141 mmol/L (ref 135–145)

## 2017-09-19 LAB — CBC
HEMATOCRIT: 46.4 % — AB (ref 36.0–46.0)
Hemoglobin: 14.8 g/dL (ref 12.0–15.0)
MCH: 32.1 pg (ref 26.0–34.0)
MCHC: 31.9 g/dL (ref 30.0–36.0)
MCV: 100.7 fL — AB (ref 78.0–100.0)
PLATELETS: 232 10*3/uL (ref 150–400)
RBC: 4.61 MIL/uL (ref 3.87–5.11)
RDW: 14 % (ref 11.5–15.5)
WBC: 9.8 10*3/uL (ref 4.0–10.5)

## 2017-09-19 LAB — PROTIME-INR
INR: 2.18
Prothrombin Time: 24.1 seconds — ABNORMAL HIGH (ref 11.4–15.2)

## 2017-09-19 MED ORDER — OXYCODONE-ACETAMINOPHEN 7.5-325 MG PO TABS
1.0000 | ORAL_TABLET | Freq: Four times a day (QID) | ORAL | Status: DC | PRN
  Administered 2017-09-19 – 2017-09-20 (×4): 1 via ORAL
  Filled 2017-09-19 (×4): qty 1

## 2017-09-19 MED ORDER — MORPHINE SULFATE (PF) 4 MG/ML IV SOLN
2.0000 mg | Freq: Four times a day (QID) | INTRAVENOUS | Status: DC | PRN
  Administered 2017-09-19: 2 mg via INTRAVENOUS
  Filled 2017-09-19: qty 1

## 2017-09-19 MED ORDER — WARFARIN SODIUM 3 MG PO TABS
3.0000 mg | ORAL_TABLET | Freq: Once | ORAL | Status: AC
  Administered 2017-09-19: 3 mg via ORAL
  Filled 2017-09-19: qty 1

## 2017-09-19 MED ORDER — WARFARIN - PHARMACIST DOSING INPATIENT
Freq: Every day | Status: DC
  Administered 2017-09-20 – 2017-09-21 (×2)

## 2017-09-19 MED ORDER — CALCITONIN (SALMON) 200 UNIT/ACT NA SOLN
1.0000 | Freq: Every day | NASAL | Status: DC
  Administered 2017-09-19 – 2017-09-24 (×6): 1 via NASAL
  Filled 2017-09-19 (×2): qty 3.7

## 2017-09-19 MED ORDER — LIDOCAINE 5 % EX PTCH
1.0000 | MEDICATED_PATCH | CUTANEOUS | Status: DC
  Administered 2017-09-19 – 2017-09-20 (×2): 1 via TRANSDERMAL
  Filled 2017-09-19 (×2): qty 1

## 2017-09-19 NOTE — Progress Notes (Signed)
Family Medicine Teaching Service Daily Progress Note Intern Pager: [phone]  Patient name: Deanna Gonzales Medical record number: [id number] Date of birth: [birth year] Age: 84 y.o. Gender: female  Primary Care Provider: Manfred Shirts, PA Consultants: Orthopedic Surgery Code Status: full (note change from DNR/DNI on admission)  Pt Overview and Major Events to Date:  2/13: Admitted  Assessment and Plan: Deanna Gonzales is a 84 y.o. female presenting with back pain following a recentlly diagnosed L4 compresion fracture. PMH is significant for A Fib (on warfarin), osteoporosis, hypothyroidism, HTN, and CKD.   Acute Back Pain 2/2 L4 Compression Fracture: In setting of untreated osteoporosis, bisphosphonates contraindicated at this time. Plan to titrate pain regimen with goal of control on PO meds and focus on increasing mobility.  - Per phone conversation with orthopedics, outpatient follow up on 09/23/17 to evaluate for possible kyphoplasty - Pain: acetaminophen 650mg  Q6H prn mild pain, increase to percocet 7.5-325 Q6H prn moderate pain, morphine 2mg  Q6H prn for severe/breakthrough pain -Start calcitonin nasal spray once daily for bone pain - ondansetron 4mg  prn Q6H prn nausea - PT/OT to eval today - consider bisphosphonate initiation 6wks post-fracture/operation  Atrial Fibrillation stable. In NSR, HR 70. - Restart warfarin - Continue home amiodarone 100mg  daily - Continue home diltiazem 24hr capsule 120mg  daily - Daily PT-INR  Leukocytosis, resolved: Patient w WBC 12.5 on admission, down to 9.8 today; no infectious symptoms.  - CTM  Hypertension: Chronic. BP 329-518A systolic, likely due to pain. Continue home meds.  - Triamterene-HCTZ 37.5-25mg  every other day - Losartan 25mg  BID  CKD III: Cr 1.3 today, increased from 1.2 on admission (per care everywhere, baseline 1.5-1.7).  - CTM - Avoid NSAIDS, nephrotoxic agents  Anxiety: Chronic, stable - Continue home alprazolam 0.25mg   BID prn, mirtazapine 15mg  qHS  Hypothyroidism: Chronic, stable. Last TSH 3.5 03/2017.  - Continue home levothyroxine 38mcg daily  FEN/GI: Heart healthy diet Prophylaxis: restart warfarin   Disposition: pending pain ctrl on po meds and PT/OT recs  Subjective:  Pt reports not sleeping well overnight due to being in a new place. She had good pain control with the prn morphine and oxycodone. She has not been seen by surgery yet. She had a large bowel movement yesterday evening and has been able to urinate.   Objective: Temp:  [98.2 F (36.8 C)-98.8 F (37.1 C)] 98.8 F (37.1 C) (02/14 0456) Pulse Rate:  [69-72] 69 (02/14 0456) Resp:  [13-19] 17 (02/14 0456) BP: (131-171)/(60-76) 171/71 (02/14 0456) SpO2:  [91 %-95 %] 93 % (02/14 0456) Weight:  [155 lb (70.3 kg)] 155 lb (70.3 kg) (02/13 1605) Physical Exam: General: resting comfortably in hospital bed, alert, NAD Cardiovascular: RRR, no m/r/g. Pacemaker palpated in R chest.  Respiratory: normal WOB on RA, lungs CTA b/l Abdomen: soft, nontender, nondistended, normoactive bowel sounds Extremities: WWP, SCDs in place, no edema. Sensation to light touch intact bilaterally in LEs.   Laboratory: Recent Labs  Lab 09/18/17 1142 09/18/17 1154 09/19/17 0533  WBC 12.5*  --  9.8  HGB 14.4 15.0 14.8  HCT 44.0 44.0 46.4*  PLT 309  --  232   Recent Labs  Lab 09/18/17 1154 09/19/17 0533  NA 143 141  K 3.9 3.8  CL 104 105  CO2  --  24  BUN 24* 22*  CREATININE 1.20* 1.31*  CALCIUM  --  9.0  GLUCOSE 81 85    Imaging/Diagnostic Tests: none  Dimple Casey, Medical Student 09/19/2017, 7:25 AM MS4, [place]  [place] Intern pager: [phone], text pages welcome  FPTS Upper-Level Resident Addendum  I have independently interviewed and examined the patient. I have discussed the above with the original author and agree with their documentation. Please see also any attending notes.   Bufford Lope, DO PGY-2, [facility] 09/19/2017 2:12 PM  FPTS Service pager: [phone] (text pages welcome through [facility])

## 2017-09-19 NOTE — Evaluation (Addendum)
Physical Therapy Evaluation Patient Details Name: Deanna Gonzales MRN: [id number] DOB: [birth year] Today's Date: 09/19/2017   History of Present Illness  84 y.o. female admitted 2/12 with low back pain that has been worsening over last 12 days. Found to have L4 compressive fracture, per Ortho MD will be awaiting progress over hospitalization before reassessing kyphoplasty vs non operative treatment.  PMH includes:  presence of permanent cardiac pacemaker, HTN, Osteoporosis, GERD, CKD stage 3.      Clinical Impression  Pt admitted with above diagnosis. Pt currently with functional limitations due to the deficits listed below (see PT Problem List). PTA, patient living alone, ambulating without AD and receiving some assistance from family PRN with chores and shopping. Upon eval, patient presents with high levels of lumbar pain, and balance deficits, that limit her mobility. Patient mod A level for bed mobility and transfers, however once up and ambulating min guard to walk hallways with RW. Patient also appears to have potential vestibular deficits during exam per balance and gait assessment. Given patients presentation she is at increased risk of falls and requires 24 hour supervision, which she does not have. Family state they work during day, although voice desire for patient to return home if possible. PT will continue to work with patient and re-assess, however likely will need SNF for short term rehab to maximize safety and functional independence.  Pt will benefit from skilled PT to increase their independence and safety with mobility to allow discharge to the venue listed below.       Follow Up Recommendations SNF;Supervision/Assistance - 24 hour    Equipment Recommendations  (TBD)    Recommendations for Other Services       Precautions / Restrictions Precautions Precautions: Fall Precaution Comments: education on log roll  Required Braces or Orthoses: (TSLO being  ordered) Restrictions Weight Bearing Restrictions: No      Mobility  Bed Mobility Overal bed mobility: Needs Assistance Bed Mobility: Rolling;Sidelying to Sit Rolling: Mod assist Sidelying to sit: Mod assist       General bed mobility comments: Mod A for log roll and powering up supine to sit, very painful for patient.   Transfers Overall transfer level: Needs assistance Equipment used: Rolling walker (2 wheeled) Transfers: Sit to/from Stand Sit to Stand: Min assist         General transfer comment: Min A to power up, cues for hand placement.   Ambulation/Gait Ambulation/Gait assistance: Min guard;Min assist Ambulation Distance (Feet): 100 Feet Assistive device: Rolling walker (2 wheeled) Gait Pattern/deviations: Step-through pattern;Antalgic Gait velocity: decreased   General Gait Details: step through pattern with slight decrease in velocity, no LOB. min guard for safety and stable inside RW.   Stairs            Wheelchair Mobility    Modified Rankin (Stroke Patients Only)       Balance Overall balance assessment: Needs assistance Sitting-balance support: No upper extremity supported;Feet supported Sitting balance-Leahy Scale: Fair       Standing balance-Leahy Scale: Fair Standing balance comment: can stand with wide base of support without UE support, RW for dynamic tasks         [place]berg - Eyes Opened: 30 Rh[place]berg - Eyes Closed: 0(can not tolerate)                 Pertinent Vitals/Pain Pain Assessment: Faces Faces Pain Scale: Hurts whole lot Pain Location: lumbar  Pain Descriptors / Indicators: Aching;Discomfort;Grimacing;Moaning Pain Intervention(s): Limited activity within patient's tolerance;Monitored during session;Premedicated  before session;Repositioned    Home Living Family/patient expects to be discharged to:: Private residence Living Arrangements: Alone Available Help at Discharge: Family;Available  PRN/intermittently Type of Home: House Home Access: Stairs to enter Entrance Stairs-Rails: Can reach both Entrance Stairs-Number of Steps: 5 Home Layout: One level Home Equipment: Walker - 2 wheels      Prior Function Level of Independence: Independent         Comments: ambulating without AD family assisting with some ADLs PRN     Hand Dominance        Extremity/Trunk Assessment   Upper Extremity Assessment Upper Extremity Assessment: Defer to OT evaluation;Overall [facility] for tasks assessed    Lower Extremity Assessment Lower Extremity Assessment: (strength BLE 4-/5, intact light touch . )    Cervical / Trunk Assessment Cervical / Trunk Assessment: Other exceptions(L4 compression fx)  Communication   Communication: No difficulties  Cognition Arousal/Alertness: Awake/alert Behavior During Therapy: WFL for tasks assessed/performed Overall Cognitive Status: Impaired/Different from baseline                                 General Comments: AOx4 for majority of session, at one point thought she was at home- but quickly corrected herself.       General Comments General comments (skin integrity, edema, etc.): Extensive discussion with family over discharge dispo and potential options and level of assistance required for safety at this point.     Exercises General Exercises - Lower Extremity Ankle Circles/Pumps: 20 reps   Assessment/Plan    PT Assessment Patient needs continued PT services  PT Problem List Decreased strength;Decreased range of motion;Decreased activity tolerance;Decreased balance;Decreased cognition;Decreased coordination;Pain;Decreased knowledge of use of DME;Decreased safety awareness       PT Treatment Interventions DME instruction;Gait training;Stair training;Functional mobility training;Therapeutic activities;Therapeutic exercise;Balance training    PT Goals (Current goals can be found in the Care Plan section)  Acute Rehab PT  Goals Patient Stated Goal: return home PT Goal Formulation: With patient/family Time For Goal Achievement: 09/26/17 Potential to Achieve Goals: Fair    Frequency Min 5X/week   Barriers to discharge Decreased caregiver support pt lives alone with only PRN support.     Co-evaluation               AM-PAC PT "6 Clicks" Daily Activity  Outcome Measure Difficulty turning over in bed (including adjusting bedclothes, sheets and blankets)?: Unable Difficulty moving from lying on back to sitting on the side of the bed? : Unable Difficulty sitting down on and standing up from a chair with arms (e.g., wheelchair, bedside commode, etc,.)?: Unable Help needed moving to and from a bed to chair (including a wheelchair)?: A Little Help needed walking in hospital room?: A Little Help needed climbing 3-5 steps with a railing? : A Lot 6 Click Score: 11    End of Session Equipment Utilized During Treatment: Gait belt Activity Tolerance: Patient limited by pain Patient left: in bed;with call bell/phone within reach;with family/visitor present Nurse Communication: Mobility status PT Visit Diagnosis: Unsteadiness on feet (R26.81);Other abnormalities of gait and mobility (R26.89);Muscle weakness (generalized) (M62.81);Pain Pain - Right/Left: (Lumbar) Pain - part of body: (Back)    Time: 0254-2706 PT Time Calculation (min) (ACUTE ONLY): 45 min   Charges:   PT Evaluation $PT Eval Moderate Complexity: 1 Mod PT Treatments $Gait Training: 8-22 mins $Self Care/Home Management: 8-22   PT G Codes:  Reinaldo Berber, PT, DPT Acute Rehab Services Pager: [phone]    Reinaldo Berber 09/19/2017, 5:43 PM

## 2017-09-19 NOTE — Progress Notes (Signed)
Responded to spiritual care Consult to support patient and assist with AD.  Patient and family at bedside asked that AD be left for their review and will have Chaplain page when they are ready. Chaplain available as needed.  Jaclynn Major, [place], [facility], Pager [phone]

## 2017-09-19 NOTE — Progress Notes (Signed)
For clarification purposes, patient was admitted for pain control due to hip osteoarthritis, per her treating physician, Dr. Rip Harbour. Patient has a known L4 compression fracture, which is to be treated conservatively for now with outpatient follow-up. Patient may ambulate, and may be discharged home once pain is adequately controlled. Full consult note to follow.

## 2017-09-19 NOTE — Progress Notes (Signed)
OT Cancellation Note  Patient Details Name: Deanna Gonzales MRN: [id number] DOB: 05-05-1928   Cancelled Treatment:    Reason Eval/Treat Not Completed: Pain limiting ability to participate(Pt c/o pain and nausea). Will follow up for OT eval as time allows.  Binnie Kand M.S., OTR/L Pager: [phone]  09/19/2017, 4:55 PM

## 2017-09-19 NOTE — Progress Notes (Signed)
OT Cancellation Note  Patient Details Name: Sena Hoopingarner MRN: [id number] DOB: 20-Sep-1927   Cancelled Treatment:    Reason Eval/Treat Not Completed: Other (comment). Pt still awaiting orthopedic consult for L4 compression fx. Will await clearance from ortho surgeon prior to initiation of OT eval.   Binnie Kand M.S., OTR/L Pager: [phone]  09/19/2017, 10:01 AM

## 2017-09-19 NOTE — Progress Notes (Signed)
ANTICOAGULATION CONSULT NOTE - Initial Consult  Pharmacy Consult for Coumadin Indication: atrial fibrillation  Allergies  Allergen Reactions  . Flexeril [Cyclobenzaprine] Anaphylaxis  . Vortioxetine Anaphylaxis  . Duloxetine Other (See Comments)    hypertension hypertension   . Lisinopril Other (See Comments)    unknown unknown   . Risedronate Other (See Comments)    arthralgia  . Risedronate Sodium Other (See Comments)    arthralgia arthralgia  . Adhesive [Tape] Rash    Gets red and welts  . Alendronate Nausea Only    Patient Measurements: Height: 5\' 8"  (172.7 cm) Weight: 155 lb (70.3 kg) IBW/kg (Calculated) : 63.9  Vital Signs: Temp: 98.7 F (37.1 C) (02/14 0700) Temp Source: Oral (02/14 0700) BP: 151/74 (02/14 0700) Pulse Rate: 70 (02/14 0700)  Labs: Recent Labs    09/18/17 1142 09/18/17 1154 09/18/17 1443 09/19/17 0533  HGB 14.4 15.0  --  14.8  HCT 44.0 44.0  --  46.4*  PLT 309  --   --  232  LABPROT  --   --  25.4* 24.1*  INR  --   --  2.33 2.18  CREATININE  --  1.20*  --  1.31*    Estimated Creatinine Clearance: 30.5 mL/min (A) (by C-G formula based on SCr of 1.31 mg/dL (H)).   Medical History: Past Medical History:  Diagnosis Date  . Age-related macular degeneration   . Anxiety   . Atrial fibrillation ([place])   . Basal cell carcinoma    "nose"  . Chronic lower back pain   . CKD (chronic kidney disease), stage III ([place])    [name] 09/18/2017  . Depression   . Family history of adverse reaction to anesthesia    "sister passed away when she was 29; from anesthesia" (09/18/2017)  . GERD (gastroesophageal reflux disease)   . History of blood transfusion    "related to OR"  . Hypertension   . Hypothyroidism   . Lumbar compression fracture ([place]) 09/06/2017   L4; Atraumatic/notes 09/18/2017  . Osteoarthritis    "all over" (09/18/2017)  . Osteoporosis   . Osteoporosis   . Presence of permanent cardiac pacemaker ?2012; ?2016  . Thyroid disease      Assessment: 84 year old female admitted for lumbar compression fracture with ongoing pain who was on Coumadin prior to admission for atrial fibrillation. No plan for surgery this admission per primary team and pharmacy has been consulted to restart Coumadin.   Home regimen is Coumadin 3mg  daily with 1.5mg  on Saturdays. Last dose per patient was 09/17/17 at 2100. Dose held on 2/13.  INR today is 2.18.  H/H is within normal limits.  Platelets are normal.   Goal of Therapy:  INR 2-3 Monitor platelets by anticoagulation protocol: Yes   Plan:  Restart Coumadin 3mg  po x1 tonight.  Daily PT/INR  Sloan Leiter, PharmD, BCPS, BCCCP Clinical Pharmacist Clinical phone 09/19/2017 until 3:30PM- [phone] After hours, please call [phone] 09/19/2017,12:45 PM

## 2017-09-19 NOTE — Consult Note (Signed)
Reason for Consult:Low back pain Referring Physician: Dr. Silvano Rusk Deanna Gonzales is an 84 y.o. female.  HPI: Patient was admitted yesterday for low back pain.  Of note, the patient began having right hip and low back pain and pelvic pain about a week and a half ago.  The patient was administered a right hip intra-articular injection, and states that her right hip and pelvic pain has been resolved ever since her injection.  Her back pain however continues, and is presently severe.  The pain is described as an aching sensation.  When she is standing and walking, her pain is minimal, however, transitions are extremely difficult for her.  This history was given to me by her physical therapist as well, Deanna Gonzales, whom I spoke with after I evaluated the patient.  The patient feels that her low back pain has not gotten any better since it first began a week and a half ago.  I did also obtain history from the patient's son and the patient's daughter-in-law.  The patient does not presently feel able to take care of herself.  She denies any pain into the right or left leg.  The pain is centered in the lower aspect of her back.  Past Medical History:  Diagnosis Date  . Age-related macular degeneration   . Anxiety   . Atrial fibrillation ([place])   . Basal cell carcinoma    "nose"  . Chronic lower back pain   . CKD (chronic kidney disease), stage III ([place])    [name] 09/18/2017  . Depression   . Family history of adverse reaction to anesthesia    "sister passed away when she was 86; from anesthesia" (09/18/2017)  . GERD (gastroesophageal reflux disease)   . History of blood transfusion    "related to OR"  . Hypertension   . Hypothyroidism   . Lumbar compression fracture ([place]) 09/06/2017   L4; Atraumatic/notes 09/18/2017  . Osteoarthritis    "all over" (09/18/2017)  . Osteoporosis   . Osteoporosis   . Presence of permanent cardiac pacemaker ?2012; ?2016  . Thyroid disease     Past Surgical History:  Procedure  Laterality Date  . ABDOMINAL EXPLORATION SURGERY  ?1960   "benign tumor on intestines"  . ABDOMINAL HYSTERECTOMY  1980   "partial"  . APPENDECTOMY    . ATRIAL ABLATION SURGERY    . BASAL CELL CARCINOMA EXCISION     nose  . CARDIAC CATHETERIZATION    . CATARACT EXTRACTION W/ INTRAOCULAR LENS  IMPLANT, BILATERAL Bilateral   . INSERT / REPLACE / REMOVE PACEMAKER  ?2012; ?2016  . PACEMAKER REMOVAL  ?2012   "1st one got infected; didn't have a pacemaker for 2-3 years"    Family History  Problem Relation Age of Onset  . Anxiety disorder Mother   . Heart disease Mother   . Hypertension Father   . Heart disease Father   . Anxiety disorder Sister   . Aneurysm Sister     Social History:  reports that  has never smoked. she has never used smokeless tobacco. She reports that she does not drink alcohol or use drugs.  Allergies:  Allergies  Allergen Reactions  . Flexeril [Cyclobenzaprine] Anaphylaxis  . Vortioxetine Anaphylaxis  . Duloxetine Other (See Comments)    hypertension hypertension   . Lisinopril Other (See Comments)    unknown unknown   . Risedronate Other (See Comments)    arthralgia  . Risedronate Sodium Other (See Comments)    arthralgia arthralgia  .  Adhesive [Tape] Rash    Gets red and welts  . Alendronate Nausea Only    Medications: I have reviewed the patient's current medications.  Results for orders placed or performed during the hospital encounter of 09/18/17 (from the past 48 hour(s))  CBC with Differential/Platelet     Status: Abnormal   Collection Time: 09/18/17 11:42 AM  Result Value Ref Range   WBC 12.5 (H) 4.0 - 10.5 K/uL   RBC 4.40 3.87 - 5.11 MIL/uL   Hemoglobin 14.4 12.0 - 15.0 g/dL   HCT 44.0 36.0 - 46.0 %   MCV 100.0 78.0 - 100.0 fL   MCH 32.7 26.0 - 34.0 pg   MCHC 32.7 30.0 - 36.0 g/dL   RDW 14.1 11.5 - 15.5 %   Platelets 309 150 - 400 K/uL   Neutrophils Relative % 77 %   Neutro Abs 9.7 (H) 1.7 - 7.7 K/uL   Lymphocytes Relative 13  %   Lymphs Abs 1.7 0.7 - 4.0 K/uL   Monocytes Relative 9 %   Monocytes Absolute 1.1 (H) 0.1 - 1.0 K/uL   Eosinophils Relative 1 %   Eosinophils Absolute 0.1 0.0 - 0.7 K/uL   Basophils Relative 0 %   Basophils Absolute 0.0 0.0 - 0.1 K/uL    Comment: Performed at [facility], [address] [place], [place] [address]  I-stat chem 8, ed     Status: Abnormal   Collection Time: 09/18/17 11:54 AM  Result Value Ref Range   Sodium 143 135 - 145 mmol/L   Potassium 3.9 3.5 - 5.1 mmol/L   Chloride 104 101 - 111 mmol/L   BUN 24 (H) 6 - 20 mg/dL   Creatinine, Ser 1.20 (H) 0.44 - 1.00 mg/dL   Glucose, Bld 81 65 - 99 mg/dL   Calcium, Ion 1.18 1.15 - 1.40 mmol/L   TCO2 27 22 - 32 mmol/L   Hemoglobin 15.0 12.0 - 15.0 g/dL   HCT 44.0 36.0 - 46.0 %  Urinalysis, Routine w reflex microscopic     Status: Abnormal   Collection Time: 09/18/17  2:43 PM  Result Value Ref Range   Color, Urine STRAW (A) YELLOW   APPearance CLEAR CLEAR   Specific Gravity, Urine 1.005 1.005 - 1.030   pH 9.0 (H) 5.0 - 8.0   Glucose, UA NEGATIVE NEGATIVE mg/dL   Hgb urine dipstick NEGATIVE NEGATIVE   Bilirubin Urine NEGATIVE NEGATIVE   Ketones, ur NEGATIVE NEGATIVE mg/dL   Protein, ur NEGATIVE NEGATIVE mg/dL   Nitrite NEGATIVE NEGATIVE   Leukocytes, UA NEGATIVE NEGATIVE    Comment: Performed at [facility], [place] [address] [place], [place] [address]  Protime-INR     Status: Abnormal   Collection Time: 09/18/17  2:43 PM  Result Value Ref Range   Prothrombin Time 25.4 (H) 11.4 - 15.2 seconds   INR 2.33     Comment: Performed at [facility] [address], [place], [place] [address]  Protime-INR     Status: Abnormal   Collection Time: 09/19/17  5:33 AM  Result Value Ref Range   Prothrombin Time 24.1 (H) 11.4 - 15.2 seconds   INR 2.18     Comment: Performed at [facility] [address], [place], [place] [address]  Basic metabolic panel     Status: Abnormal   Collection Time: 09/19/17   5:33 AM  Result Value Ref Range   Sodium 141 135 - 145 mmol/L   Potassium  3.8 3.5 - 5.1 mmol/L   Chloride 105 101 - 111 mmol/L   CO2 24 22 - 32 mmol/L   Glucose, Bld 85 65 - 99 mg/dL   BUN 22 (H) 6 - 20 mg/dL   Creatinine, Ser 1.31 (H) 0.44 - 1.00 mg/dL   Calcium 9.0 8.9 - 10.3 mg/dL   GFR calc non Af Amer 35 (L) >60 mL/min   GFR calc Af Amer 41 (L) >60 mL/min    Comment: (NOTE) The eGFR has been calculated using the CKD EPI equation. This calculation has not been validated in all clinical situations. eGFR's persistently <60 mL/min signify possible Chronic Kidney Disease.    Anion gap 12 5 - 15    Comment: Performed at [facility] [address] [place], [place] [address]  CBC     Status: Abnormal   Collection Time: 09/19/17  5:33 AM  Result Value Ref Range   WBC 9.8 4.0 - 10.5 K/uL   RBC 4.61 3.87 - 5.11 MIL/uL   Hemoglobin 14.8 12.0 - 15.0 g/dL   HCT 46.4 (H) 36.0 - 46.0 %   MCV 100.7 (H) 78.0 - 100.0 fL   MCH 32.1 26.0 - 34.0 pg   MCHC 31.9 30.0 - 36.0 g/dL   RDW 14.0 11.5 - 15.5 %   Platelets 232 150 - 400 K/uL    Comment: Performed at [facility], [place] [address] [place], [place] [address]    No results found.  Review of Systems  Constitutional: Negative.   HENT: Negative.   Eyes: Negative.   Cardiovascular: Negative.   Gastrointestinal: Positive for nausea.  Skin: Negative.   Neurological: Negative.   Psychiatric/Behavioral: Negative.    Blood pressure 138/62, pulse 70, temperature 98.4 F (36.9 C), temperature source Oral, resp. rate 17, height '5\' 8"'$  (1.727 m), weight 155 lb (70.3 kg), SpO2 97 %. Physical Exam  Constitutional: She is oriented to person, place, and time. She appears well-developed and well-nourished.  HENT:  Head: Normocephalic and atraumatic.  Eyes: Pupils are equal, round, and reactive to light.  Neck: Normal range of motion.  Respiratory: Effort normal.  GI: Soft.  Musculoskeletal: Normal range of motion. She exhibits  tenderness.  + TTP in lower lumbar region  Neurological: She is alert and oriented to person, place, and time.  Skin: Skin is warm and dry.   CT scan dated 09/12/2017 notable for an L4 compression fracture  Assessment/Plan: L4 compression fracture, currently resulting in rather severe low back pain  I did discuss treatment options with the patient and her son.  I do feel that one reasonable option would be to get her fracture additional time to heal, as I do anticipate that her pain will lessen progressively over the course of the next 2-3 months.  However, she clearly is currently very debilitated, and is having substantial difficulty with transitions, such as getting out of bed.  She therefore will need to be transferred to a skilled nursing facility to await improvement in her pain.  I do think this is 1 appropriate option.  I do feel that another appropriate option would be continued close surveillance, and potentially proceeding with a kyphoplasty procedure next week, depending on whether she gains any progress in her current level of pain over the course of the next few days.  If this is the plan that the patient, myself, and her husband decided upon.  She therefore will continue with physical therapy, and will continue getting pain  medications as needed.  I will reevaluate her early next week, in order to identify whether her pain is improving, staying the same, or worsening.  If her pain is staying the same or worsening, we will likely additionally discuss a kyphoplasty procedure, the details of which were discussed with the patient and her husband today.  All of their questions were answered.   I would also recommend the patient obtaining a TLSO brace, as this will help provide stability and minimize the current pain from her L4 compression fracture.  [place] 09/19/2017, 4:05 PM

## 2017-09-19 NOTE — Progress Notes (Signed)
PT Cancellation Note  Patient Details Name: Deanna Gonzales MRN: [id number] DOB: [birth year]   Cancelled Treatment:      Reason Eval/Treat Not Completed: Other (comment). Pt still awaiting orthopedic consult for L4 compression fx. Will await clearance from ortho surgeon prior to initiation of PT eval.    Reinaldo Berber, PT, DPT Acute Rehab Services Pager: [phone]    Reinaldo Berber 09/19/2017, 10:30 AM

## 2017-09-20 LAB — PROTIME-INR
INR: 2.21
Prothrombin Time: 24.3 seconds — ABNORMAL HIGH (ref 11.4–15.2)

## 2017-09-20 MED ORDER — BACLOFEN 5 MG HALF TABLET: 2.5000 mg | ORAL_TABLET | Freq: Three times a day (TID) | ORAL | Status: DC | PRN

## 2017-09-20 MED ORDER — CHOLECALCIFEROL 10 MCG (400 UNIT) PO TABS
800.0000 [IU] | ORAL_TABLET | Freq: Every day | ORAL | Status: DC
  Filled 2017-09-20: qty 2

## 2017-09-20 MED ORDER — BACLOFEN 10 MG PO TABS
5.0000 mg | ORAL_TABLET | Freq: Two times a day (BID) | ORAL | Status: DC | PRN
  Administered 2017-09-20 – 2017-09-22 (×3): 5 mg via ORAL
  Filled 2017-09-20 (×3): qty 1

## 2017-09-20 MED ORDER — PROMETHAZINE HCL 25 MG PO TABS
12.5000 mg | ORAL_TABLET | Freq: Once | ORAL | Status: AC
  Administered 2017-09-20: 12.5 mg via ORAL
  Filled 2017-09-20: qty 1

## 2017-09-20 MED ORDER — CALCIUM CARBONATE 1250 (500 CA) MG PO TABS
1.0000 | ORAL_TABLET | Freq: Every day | ORAL | Status: DC
  Administered 2017-09-21 – 2017-09-26 (×5): 500 mg via ORAL
  Filled 2017-09-20 (×5): qty 1

## 2017-09-20 MED ORDER — OXYCODONE HCL 5 MG PO TABS
5.0000 mg | ORAL_TABLET | ORAL | Status: DC | PRN
  Administered 2017-09-20 – 2017-09-25 (×10): 5 mg via ORAL
  Filled 2017-09-20 (×10): qty 1

## 2017-09-20 MED ORDER — LEVOTHYROXINE SODIUM 100 MCG PO TABS
100.0000 ug | ORAL_TABLET | Freq: Every day | ORAL | Status: DC
  Administered 2017-09-21 – 2017-09-26 (×5): 100 ug via ORAL
  Filled 2017-09-20 (×5): qty 1

## 2017-09-20 MED ORDER — ACETAMINOPHEN 500 MG PO TABS
1000.0000 mg | ORAL_TABLET | Freq: Three times a day (TID) | ORAL | Status: DC
  Administered 2017-09-20 – 2017-09-26 (×17): 1000 mg via ORAL
  Filled 2017-09-20 (×17): qty 2

## 2017-09-20 MED ORDER — WARFARIN SODIUM 3 MG PO TABS
3.0000 mg | ORAL_TABLET | Freq: Once | ORAL | Status: AC
  Administered 2017-09-20: 3 mg via ORAL
  Filled 2017-09-20: qty 1

## 2017-09-20 NOTE — Progress Notes (Signed)
ANTICOAGULATION CONSULT NOTE - Follow Up Consult  Pharmacy Consult for Coumadin Indication: atrial fibrillation  Allergies  Allergen Reactions  . Flexeril [Cyclobenzaprine] Anaphylaxis  . Vortioxetine Anaphylaxis  . Duloxetine Other (See Comments)    hypertension hypertension   . Lisinopril Other (See Comments)    unknown unknown   . Risedronate Other (See Comments)    arthralgia  . Risedronate Sodium Other (See Comments)    arthralgia arthralgia  . Adhesive [Tape] Rash    Gets red and welts  . Alendronate Nausea Only    Patient Measurements: Height: 5\' 8"  (172.7 cm) Weight: 155 lb (70.3 kg) IBW/kg (Calculated) : 63.9  Vital Signs: Temp: 98 F (36.7 C) (02/15 0433) Temp Source: Oral (02/15 0433) BP: 137/69 (02/15 0433) Pulse Rate: 69 (02/15 0433)  Labs: Recent Labs    09/18/17 1142 09/18/17 1154 09/18/17 1443 09/19/17 0533 09/20/17 0637  HGB 14.4 15.0  --  14.8  --   HCT 44.0 44.0  --  46.4*  --   PLT 309  --   --  232  --   LABPROT  --   --  25.4* 24.1* 24.3*  INR  --   --  2.33 2.18 2.21  CREATININE  --  1.20*  --  1.31*  --     Estimated Creatinine Clearance: 30.5 mL/min (A) (by C-G formula based on SCr of 1.31 mg/dL (H)).  Assessment: 84 year old female on chronic Coumadin for atrial fibrillation. Pharmacy consulted to resume inpatient.   INR today is 2.21, therapeutic after receiving home dose last night. No new significant drug interactions noted. Plan is conservative management of back compression fracture for now.   Home regimen was 3 mg daily except 1.5mg  on Saturday.   Goal of Therapy:  INR 2-3 Monitor platelets by anticoagulation protocol: Yes   Plan:  Coumadin 3mg  po x1 tonight.  Daily PT/INR  Sloan Leiter, PharmD, BCPS, BCCCP Clinical Pharmacist Clinical phone 09/20/2017 until 3:30PM - [phone] After hours, please call #[phone] 09/20/2017,10:08 AM

## 2017-09-20 NOTE — Evaluation (Signed)
Occupational Therapy Evaluation Patient Details Name: Deanna Gonzales MRN: [id number] DOB: 04-Jul-1928 Today's Date: 09/20/2017    History of Present Illness 84 y.o. female admitted 2/12 with low back pain that has been worsening over last 12 days. Found to have L4 compressive fracture, per Ortho MD will be awaiting progress over hospitalization before reassessing kyphoplasty vs non operative treatment.  PMH includes:  presence of permanent cardiac pacemaker, HTN, Osteoporosis, GERD, CKD stage 3.    Clinical Impression   Pt reports she has required increased assist with ADL and mobility over the past few weeks, prior to that she was completely independent. Currently pt overall min guard for functional mobility and min assist for ADL. Pt with improvements in back pain today, reporting 3-5/10 throughout session. Pt with episode of nausea and emesis at end of OT session; RN notified. Pending progress and pain control; hopeful for pt to be able to return home with family supervision. At this time, recommending HHOT for follow up but may need short term SNF stay if unable to progress acutely. Pt would benefit from continued skilled OT to address established goals.    Follow Up Recommendations  Home health OT;Supervision/Assistance - 24 hour(pending progress/pain control, may need SNF)    Equipment Recommendations  3 in 1 bedside commode    Recommendations for Other Services       Precautions / Restrictions Precautions Precautions: Fall Precaution Comments: Per ortho surgeon note; ordering TLSO--not present in room Restrictions Weight Bearing Restrictions: No      Mobility Bed Mobility Overal bed mobility: Needs Assistance Bed Mobility: Rolling;Sidelying to Sit Rolling: Supervision Sidelying to sit: Min assist       General bed mobility comments: Light min assist for trunk elevation to sitting. Cues for log roll technique. Increased time and effort, HOB slightly elevated with use of bed  rails  Transfers Overall transfer level: Needs assistance Equipment used: Rolling walker (2 wheeled) Transfers: Sit to/from Stand Sit to Stand: Min assist         General transfer comment: to boost up from EOB x1, toilet x1. Cues for hand placement    Balance Overall balance assessment: Needs assistance Sitting-balance support: Feet supported;No upper extremity supported Sitting balance-Leahy Scale: Good     Standing balance support: No upper extremity supported;During functional activity Standing balance-Leahy Scale: Fair                             ADL either performed or assessed with clinical judgement   ADL Overall ADL's : Needs assistance/impaired Eating/Feeding: Set up;Sitting   Grooming: Min guard;Standing;Wash/dry face;Wash/dry hands   Upper Body Bathing: Minimal assistance;Sitting   Lower Body Bathing: Minimal assistance;Sit to/from stand   Upper Body Dressing : Minimal assistance;Sitting   Lower Body Dressing: Minimal assistance;Sit to/from stand   Toilet Transfer: Ambulation;Comfort height toilet;RW;Minimal assistance Toilet Transfer Details (indicate cue type and reason): Would benefit from use of 3 in 1; min assist to boost up and min guard for mobility Toileting- Clothing Manipulation and Hygiene: Set up;Supervision/safety;Sitting/lateral lean       Functional mobility during ADLs: Min guard;Rolling walker General ADL Comments: Pt with c/o nausea when returning to room from bathroom; pt with few episodes of emesis once in sitting; notified RN.     Vision         Perception     Praxis      Pertinent Vitals/Pain Pain Assessment: 0-10 Pain Score: 5  Pain Location:  back following activity Pain Descriptors / Indicators: Discomfort;Aching Pain Intervention(s): Monitored during session;Repositioned     Hand Dominance Right   Extremity/Trunk Assessment Upper Extremity Assessment Upper Extremity Assessment: Generalized weakness    Lower Extremity Assessment Lower Extremity Assessment: Defer to PT evaluation   Cervical / Trunk Assessment Cervical / Trunk Assessment: Other exceptions Cervical / Trunk Exceptions: L4 compression fx   Communication Communication Communication: No difficulties   Cognition Arousal/Alertness: Awake/alert Behavior During Therapy: WFL for tasks assessed/performed Overall Cognitive Status: Within Functional Limits for tasks assessed                                     General Comments       Exercises     Shoulder Instructions      Home Living Family/patient expects to be discharged to:: Private residence Living Arrangements: Alone Available Help at Discharge: Family;Available PRN/intermittently(Family has been providing 24/7 S the past few weeks) Type of Home: House Home Access: Stairs to enter [employer] of Steps: 5 Entrance Stairs-Rails: Can reach both Home Layout: One level     Bathroom Shower/Tub: Teacher, early years/pre: Standard     Home Equipment: Environmental consultant - 2 wheels          Prior Functioning/Environment Level of Independence: Independent        Comments: ambulating without AD family assisting with some ADLs PRN. Recently family has been assisting with ADL and mobility due to pain.        OT Problem List: Decreased strength;Decreased activity tolerance;Impaired balance (sitting and/or standing);Decreased knowledge of use of DME or AE;Decreased knowledge of precautions;Pain      OT Treatment/Interventions: Self-care/ADL training;DME and/or AE instruction;Therapeutic activities;Patient/family education;Balance training    OT Goals(Current goals can be found in the care plan section) Acute Rehab OT Goals Patient Stated Goal: return home OT Goal Formulation: With patient Time For Goal Achievement: 10/04/17 Potential to Achieve Goals: Good ADL Goals Pt Will Perform Lower Body Bathing: with supervision;sit to/from  stand(with or without AE) Pt Will Perform Lower Body Dressing: with supervision;sit to/from stand(with or without AE) Pt Will Transfer to Toilet: with supervision;ambulating;bedside commode Pt Will Perform Toileting - Clothing Manipulation and hygiene: with supervision;sit to/from stand Pt Will Perform Tub/Shower Transfer: Tub transfer;ambulating;3 in 1;rolling walker;with supervision Additional ADL Goal #1: Pt will independently verbally recall 3/3 back precautions and maintain throughout ADL. Additional ADL Goal #2: Pt will don/doff back brace with set up as precursor to ADL.  OT Frequency: Min 2X/week   Barriers to D/C:            Co-evaluation              AM-PAC PT "6 Clicks" Daily Activity     Outcome Measure Help from another person eating meals?: None Help from another person taking care of personal grooming?: A Little Help from another person toileting, which includes using toliet, bedpan, or urinal?: A Little Help from another person bathing (including washing, rinsing, drying)?: A Little Help from another person to put on and taking off regular upper body clothing?: A Little Help from another person to put on and taking off regular lower body clothing?: A Little 6 Click Score: 19   End of Session Equipment Utilized During Treatment: Rolling walker Nurse Communication: Mobility status;Other (comment)(pt with nausea and emesis)  Activity Tolerance: Patient tolerated treatment well Patient left: in chair;with call bell/phone within  reach;with family/visitor present  OT Visit Diagnosis: Unsteadiness on feet (R26.81);Pain Pain - part of body: (back)                Time: 6754-4920 OT Time Calculation (min): 24 min Charges:  OT General Charges $OT Visit: 1 Visit OT Evaluation $OT Eval Moderate Complexity: 1 Mod OT Treatments $Self Care/Home Management : 8-22 mins G-Codes:     Ezelle Surprenant A. Ulice Brilliant, M.S., OTR/L Pager: [place] 09/20/2017, 9:29 AM

## 2017-09-20 NOTE — Progress Notes (Signed)
Orthopedic Tech Progress Note Patient Details:  Deanna Gonzales [birth year] [id number]  Patient ID: [name], female   DOB: 07/09/1928, 84 y.o.   MRN: [id number]   [place] 09/20/2017, 9:16 AMCalled [employer] for  TLSO Brace.

## 2017-09-20 NOTE — Discharge Summary (Signed)
[facility] Discharge Summary  Patient name: Deanna Gonzales Medical record number: [id number] Date of birth: 1928/06/06 Age: 84 y.o. Gender: female Date of Admission: 09/18/2017  Date of Discharge: 09/26/2017 Admitting Physician: Alveda Reasons, MD  Primary Care Provider: Manfred Shirts, PA Consultants: orthopedic surgery  Indication for Hospitalization: Acute Vertebral Compression Fracture  Discharge Diagnoses/Problem List:  Acute Back pain 2/2 L4 Compression Fracture/Osteoporosis  Paroxysmal A Fib HTN CKD III Anxiety  Hypothyroidism  Disposition: SNF  Discharge Condition: Fair  Discharge Exam:  General: groggy, will fall asleep during exam, laying in bed  Cardiovascular: RRR, no MRG Respiratory: CTAB, no wheezes, rales or rhonchi  Abdomen: soft, non tender, non distended, bowel sounds normal  Extremities: no edema, non tender to palpation   Brief Hospital Course:  Deanna Gonzales is a 84 yo F presenting for L4 Compression fracture. Past medical history notable for HTN, osteoporosis, A fib (on warfarin), CKD III and hypothyroidism who presented to the ED with severe lower back pain   Patient was admitted with acute low back pain and found to have L4 compression fracture and osteoporosis. Patient was seen by orthopedics and recommend for kyphoplasty which occurred on 09/25/17. Orthopedic recommendations of no restrictions and progress activity as tolerated, LSO brace prn for comfort, and to keep incisions dry for 5 days and then can remove bandages and shower normally. Recommend outpatient follow up.   For osteoporosis patient was on calcium and vitamin d supplementation. Recommended that patient start bisphosphonate treatment 6 weeks post op pending renal function.   Issues for Follow Up:  1. Osteoporosis: consider starting patient on bisphosphonate 6wks post fracture (around 10/11/2017) if Cr clearance >30. Could also consider denosumab, which is not  renally cleared.  2. Patient should be continued on calcium and Vit D supplementation for osteoporotic management.   3. Monitor INR 4. Remove bandages in 5 days. Outpatient follow up with orthopedics in 2 weeks 5. LSO brace prn for comfort  6. Per orthopedics, no restrictions and can progress activity as tolerated  7. Will need to continue PT  Significant Procedures:  09/25/17 Kyphoplasty   Significant Labs and Imaging:  Recent Labs  Lab 09/23/17 0300 09/24/17 0616 09/25/17 0431  WBC 8.9 9.8 7.5  HGB 13.4 14.1 13.0  HCT 41.8 42.9 41.6  PLT 231 217 229   Recent Labs  Lab 09/23/17 0300 09/24/17 0616 09/25/17 0431 09/26/17 0747  NA 137 139 139 140  K 4.2 3.9 3.8 4.3  CL 102 103 104 106  CO2 26 26 26 25   GLUCOSE 97 85 89 84  BUN 26* 20 22* 15  CREATININE 1.43* 1.35* 1.53* 1.24*  CALCIUM 8.8* 9.0 8.7* 8.7*   PTH: 40 Vit D: 57.2  Dg Lumbar Spine 2-3 Views  Result Date: 09/25/2017 CLINICAL DATA:  L4 kyphoplasty. EXAM: LUMBAR SPINE - 2-3 VIEW; DG C-ARM 61-120 MIN COMPARISON:  CT of the lumbar spine 09/12/2017. FINDINGS: Four intraoperative images of the lumbar spine document bipedicular spinal augmentation at L4. Methylmethacrylate extends to the superior endplate of L4 anteriorly without significant extrusion into the disc space. Cement is predominantly within the created cavities. There is some trabecular infiltration posteriorly on the right. Methylmethacrylate does extend to the posterior wall of the L4 vertebral body. IMPRESSION: 1. Intraoperative bipedicular spinal augmentation at L4 without radiographic evidence for complication. Electronically Signed   By: San Morelle M.D.   On: 09/25/2017 11:58   Ct Lumbar Spine Wo Contrast  Result Date: 09/12/2017  CLINICAL DATA:  Right hip pain for 1 week. Groin pain. EXAM: CT LUMBAR SPINE WITHOUT CONTRAST TECHNIQUE: Multidetector CT imaging of the lumbar spine was performed without intravenous contrast administration.  Multiplanar CT image reconstructions were also generated. COMPARISON:  None. FINDINGS: Segmentation: 5 lumbar type vertebrae. Alignment: No static listhesis.  Mild kyphosis centered at L1. Vertebrae: Generalized osteopenia. Chronic L1 vertebral body compression fracture with approximately 60% height loss. Acute L4 vertebral body compression fracture with approximately 40% central height loss. Remainder the vertebral body heights are maintained. Paraspinal and other soft tissues: No paraspinal abnormality. Abdominal aortic atherosclerosis. Bilateral parapelvic cysts. Other: Mild osteoarthritis of bilateral sacroiliac joints. Disc levels: Disc spaces are maintained. No foraminal or central canal stenosis. IMPRESSION: 1. Acute L4 vertebral body compression fracture with approximately 40% central height loss. 2. Chronic L1 vertebral body compression fracture with approximately 60% height loss. Electronically Signed   By: Kathreen Devoid   On: 09/12/2017 16:03   Dg C-arm 1-60 Min  Result Date: 09/25/2017 CLINICAL DATA:  L4 kyphoplasty. EXAM: LUMBAR SPINE - 2-3 VIEW; DG C-ARM 61-120 MIN COMPARISON:  CT of the lumbar spine 09/12/2017. FINDINGS: Four intraoperative images of the lumbar spine document bipedicular spinal augmentation at L4. Methylmethacrylate extends to the superior endplate of L4 anteriorly without significant extrusion into the disc space. Cement is predominantly within the created cavities. There is some trabecular infiltration posteriorly on the right. Methylmethacrylate does extend to the posterior wall of the L4 vertebral body. IMPRESSION: 1. Intraoperative bipedicular spinal augmentation at L4 without radiographic evidence for complication. Electronically Signed   By: San Morelle M.D.   On: 09/25/2017 11:58    Results/Tests Pending at Time of Discharge:  Unresulted Labs (From admission, onward)   Start     Ordered   09/20/17 0500  Protime-INR  Daily,   R     09/19/17 1252       Discharge Medications:  Allergies as of 09/26/2017      Reactions   Flexeril [cyclobenzaprine] Anaphylaxis   Vortioxetine Anaphylaxis   Duloxetine Other (See Comments)   hypertension hypertension   Lisinopril Other (See Comments)   unknown unknown   Risedronate Other (See Comments)   arthralgia   Risedronate Sodium Other (See Comments)   arthralgia arthralgia   Adhesive [tape] Rash   Gets red and welts   Alendronate Nausea Only      Medication List    STOP taking these medications   hydrochlorothiazide 25 MG tablet Commonly known as:  HYDRODIURIL   oxyCODONE-acetaminophen 5-325 MG tablet Commonly known as:  PERCOCET/ROXICET     TAKE these medications   ALPRAZolam 0.25 MG tablet Commonly known as:  XANAX Take 0.25 mg by mouth at bedtime.   amiodarone 100 MG tablet Commonly known as:  PACERONE Take 100 mg by mouth daily.   Baclofen 5 MG Tabs Take 5 mg by mouth 3 (three) times daily.   calcitonin (salmon) 200 UNIT/ACT nasal spray Commonly known as:  MIACALCIN/FORTICAL Place 1 spray into alternate nostrils daily.   calcium carbonate 1250 (500 Ca) MG tablet Commonly known as:  OS-CAL - dosed in mg of elemental calcium Take 1 tablet (500 mg of elemental calcium total) by mouth daily with breakfast. Start taking on:  09/27/2017   Cholecalciferol 1000 units tablet Take 1 tablet (1,000 Units total) by mouth daily. Start taking on:  09/27/2017   diltiazem 120 MG tablet Commonly known as:  CARDIZEM Take 120 mg by mouth daily.   gabapentin 100 MG  capsule Commonly known as:  NEURONTIN Take 200 mg by mouth 2 (two) times daily.   levothyroxine 100 MCG tablet Commonly known as:  SYNTHROID, LEVOTHROID Take 100 mcg by mouth daily.   losartan 25 MG tablet Commonly known as:  COZAAR Take 25 mg by mouth 2 (two) times daily.   mirtazapine 15 MG tablet Commonly known as:  REMERON Take 15 mg by mouth at bedtime.   oxyCODONE 5 MG immediate release  tablet Commonly known as:  Oxy IR/ROXICODONE Take 1 tablet (5 mg total) by mouth every 4 (four) hours as needed for severe pain.   polyethylene glycol packet Commonly known as:  MIRALAX / GLYCOLAX Take 17 g by mouth daily. Start taking on:  09/27/2017   senna-docusate 8.6-50 MG tablet Commonly known as:  Senokot-S Take 1 tablet by mouth 2 (two) times daily as needed for mild constipation.   triamterene-hydrochlorothiazide 37.5-25 MG tablet Commonly known as:  MAXZIDE-25 Take 1 tablet by mouth every other day. Start taking on:  09/28/2017   warfarin 3 MG tablet Commonly known as:  COUMADIN Take 1.5-3 mg by mouth See admin instructions. Take 1.5mg  on SAT and 3mg  on all other days       Discharge Instructions: Please refer to Patient Instructions section of EMR for full details.  Patient was counseled important signs and symptoms that should prompt return to medical care, changes in medications, dietary instructions, activity restrictions, and follow up appointments.   Follow-Up Appointments: Contact information for after-discharge care    Destination    [facility] .   Service:  Skilled Nursing Contact information: [place] [phone]              Caroline More, [place] 09/26/2017, 11:41 AM PGY-1, [facility]

## 2017-09-20 NOTE — Progress Notes (Signed)
Physical Therapy Treatment Patient Details Name: Deanna Gonzales MRN: [id number] DOB: [birth year] Today's Date: 09/20/2017    History of Present Illness 84 y.o. female admitted 2/12 with low back pain that has been worsening over last 12 days. Found to have L4 compressive fracture, per Ortho MD will be awaiting progress over hospitalization before reassessing kyphoplasty vs non operative treatment.  PMH includes:  presence of permanent cardiac pacemaker, HTN, Osteoporosis, GERD, CKD stage 3.     PT Comments    Patient is making gradual progress toward mobility goals. Pt required min A for bed mobility and min guard for safe ambulation this session. Pt continues to be limited by pain and nausea. RN aware. Continue to progress as tolerated.    Follow Up Recommendations  SNF;Supervision/Assistance - 24 hour     Equipment Recommendations  (TBD)    Recommendations for Other Services       Precautions / Restrictions Precautions Precautions: Fall Required Braces or Orthoses: (TLSO) Restrictions Weight Bearing Restrictions: No    Mobility  Bed Mobility Overal bed mobility: Needs Assistance Bed Mobility: Rolling;Sidelying to Sit Rolling: Supervision Sidelying to sit: Min assist       General bed mobility comments: cues for sequencing; assist to elevate trunk into sitting due to pain; pt used rail when rolling onto side  Transfers Overall transfer level: Needs assistance Equipment used: Rolling walker (2 wheeled) Transfers: Sit to/from Stand Sit to Stand: Min assist;Min guard         General transfer comment: cues for hand placement; min A from EOB and min guard for safety from commode with use of grab bar  Ambulation/Gait Ambulation/Gait assistance: Min guard;Min assist Ambulation Distance (Feet): 24 Feet Assistive device: Rolling walker (2 wheeled) Gait Pattern/deviations: Step-through pattern;Decreased stride length Gait velocity: decreased   General Gait Details:  cues for posture and safe proximity to RW at times; guarded movement and slow, but steady gait; pt limited by pain and nausea   Stairs            Wheelchair Mobility    Modified Rankin (Stroke Patients Only)       Balance Overall balance assessment: Needs assistance Sitting-balance support: No upper extremity supported;Feet supported Sitting balance-Leahy Scale: Fair       Standing balance-Leahy Scale: Fair Standing balance comment: can stand with wide base of support without UE support, RW for dynamic tasks                            Cognition Arousal/Alertness: Awake/alert Behavior During Therapy: WFL for tasks assessed/performed Overall Cognitive Status: Within Functional Limits for tasks assessed                                        Exercises      General Comments        Pertinent Vitals/Pain Pain Assessment: Faces Faces Pain Scale: Hurts even more Pain Location: lumbar  Pain Descriptors / Indicators: Aching;Discomfort;Grimacing Pain Intervention(s): Limited activity within patient's tolerance;Monitored during session;Premedicated before session;Repositioned    Home Living                      Prior Function            PT Goals (current goals can now be found in the care plan section) Acute Rehab PT Goals Patient Stated Goal: return  home PT Goal Formulation: With patient/family Time For Goal Achievement: 09/26/17 Potential to Achieve Goals: Fair Progress towards PT goals: Progressing toward goals    Frequency    Min 5X/week      PT Plan Current plan remains appropriate    Co-evaluation              AM-PAC PT "6 Clicks" Daily Activity  Outcome Measure  Difficulty turning over in bed (including adjusting bedclothes, sheets and blankets)?: Unable Difficulty moving from lying on back to sitting on the side of the bed? : Unable Difficulty sitting down on and standing up from a chair with arms  (e.g., wheelchair, bedside commode, etc,.)?: Unable Help needed moving to and from a bed to chair (including a wheelchair)?: A Little Help needed walking in hospital room?: A Little Help needed climbing 3-5 steps with a railing? : A Lot 6 Click Score: 11    End of Session Equipment Utilized During Treatment: Gait belt Activity Tolerance: Patient limited by pain;Other (comment)(limited by nausea) Patient left: with call bell/phone within reach;with family/visitor present;in chair Nurse Communication: Mobility status PT Visit Diagnosis: Unsteadiness on feet (R26.81);Other abnormalities of gait and mobility (R26.89);Muscle weakness (generalized) (M62.81);Pain Pain - Right/Left: (Lumbar) Pain - part of body: (Back)     Time: 2951-8841 PT Time Calculation (min) (ACUTE ONLY): 30 min  Charges:  $Gait Training: 8-22 mins $Therapeutic Activity: 8-22 mins                    G Codes:       Earney Navy, PTA Pager: [phone]     Darliss Cheney 09/20/2017, 2:11 PM

## 2017-09-20 NOTE — NC FL2 (Addendum)
[place] MEDICAID FL2 LEVEL OF CARE SCREENING TOOL     IDENTIFICATION  Patient Name: Deanna Gonzales Birthdate: 08/31/1927 Sex: female Admission Date (Current Location): 09/18/2017  [facility] and [place] Number:  Herbalist and Address:  [facility] [place], [place] [address] [place], [place] [address]      Provider Number: [id number]  Attending Physician Name and Address:  No att. providers found  Relative Name and Phone Number:  Tammy Nifong [address]    Current Level of Care: Hospital Recommended Level of Care: [place] Prior Approval Number:    Date Approved/Denied:   PASRR Number:  [id number]  Discharge Plan: SNF    Current Diagnoses: Patient Active Problem List   Diagnosis Date Noted  . Lumbar compression fracture ([place]) 09/18/2017  . Atrial fibrillation ([place]) 09/18/2017  . Anxiety 09/18/2017  . Acute midline low back pain     Orientation RESPIRATION BLADDER Height & Weight     Self, Time, Situation, Place  Normal Incontinent, External catheter Weight: 155 lb (70.3 kg) Height:  5\' 8"  (172.7 cm)  BEHAVIORAL SYMPTOMS/MOOD NEUROLOGICAL BOWEL NUTRITION STATUS      Continent Diet(see discharge summary)  AMBULATORY STATUS COMMUNICATION OF NEEDS Skin   Limited Assist Verbally Normal                       Personal Care Assistance Level of Assistance  Bathing, Feeding, Dressing Bathing Assistance: Limited assistance Feeding assistance: Independent Dressing Assistance: Limited assistance     Functional Limitations Info  Hearing, Speech, Sight Sight Info: Adequate Hearing Info: Adequate Speech Info: Adequate    SPECIAL CARE FACTORS FREQUENCY  PT (By licensed PT), OT (By licensed OT)     PT Frequency: 3x week OT Frequency: 2x week            Contractures Contractures Info: Not present    Additional Factors Info  Code Status, Allergies, Psychotropic Code Status Info: Full Code Allergies Info:  FLEXERIL CYCLOBENZAPRINE, VORTIOXETINE, DULOXETINE, LISINOPRIL, RISEDRONATE, RISEDRONATE SODIUM, ADHESIVE TAPE, ALENDRONATE    mirtazapine (REMERON) tablet 15 mg at bedtime         Current Medications (09/20/2017):  This is the current hospital active medication list Current Facility-Administered Medications  Medication Dose Route Frequency Provider Last Rate Last Dose  . 0.9 %  sodium chloride infusion  250 mL Intravenous PRN Bufford Lope, DO      . acetaminophen (TYLENOL) tablet 650 mg  650 mg Oral Q6H PRN Bufford Lope, DO      . ALPRAZolam Duanne Moron) tablet 0.25 mg  0.25 mg Oral BID PRN Orson Eva J, DO   0.25 mg at 09/20/17 1347  . amiodarone (PACERONE) tablet 100 mg  100 mg Oral Daily Orson Eva J, DO   100 mg at 09/20/17 0931  . calcitonin (salmon) (MIACALCIN/FORTICAL) nasal spray 1 spray  1 spray Alternating Nares Daily Orson Eva J, DO   1 spray at 09/20/17 1036  . diltiazem (CARDIZEM CD) 24 hr capsule 120 mg  120 mg Oral Daily Shawna Orleans, Elsia J, DO   120 mg at 09/20/17 0932  . [START ON 09/21/2017] levothyroxine (SYNTHROID, LEVOTHROID) tablet 100 mcg  100 mcg Oral QAC breakfast Rumball, Alison, DO      . lidocaine (LIDODERM) 5 % 1 patch  1 patch Transdermal Q24H Orson Eva J, DO   1 patch at 09/20/17 1133  . losartan (COZAAR) tablet 25 mg  25 mg Oral BID  Bufford Lope, DO   25 mg at 09/20/17 0931  . mirtazapine (REMERON) tablet 15 mg  15 mg Oral QHS Orson Eva J, DO   15 mg at 09/19/17 2100  . ondansetron (ZOFRAN) tablet 4 mg  4 mg Oral Q6H PRN Orson Eva J, DO       Or  . ondansetron (ZOFRAN) injection 4 mg  4 mg Intravenous Q6H PRN Orson Eva J, DO   4 mg at 09/20/17 0931  . oxyCODONE-acetaminophen (PERCOCET) 7.5-325 MG per tablet 1 tablet  1 tablet Oral Q6H PRN Bufford Lope, DO   1 tablet at 09/20/17 1337  . polyethylene glycol (MIRALAX / GLYCOLAX) packet 17 g  17 g Oral Daily PRN Orson Eva J, DO      . sodium chloride flush (NS) 0.9 % injection 3 mL  3 mL Intravenous Q12H Yoo, Elsia J, DO    3 mL at 09/20/17 0932  . sodium chloride flush (NS) 0.9 % injection 3 mL  3 mL Intravenous PRN Bufford Lope, DO      . triamterene-hydrochlorothiazide (MAXZIDE-25) 37.5-25 MG per tablet 1 tablet  1 tablet Oral Roberto Scales J, DO   1 tablet at 09/20/17 0931  . warfarin (COUMADIN) tablet 3 mg  3 mg Oral ONCE-1800 Priscella Mann, RPH      . Warfarin - Pharmacist Dosing Inpatient   Does not apply [place], [facility]         Discharge Medications: Please see discharge summary for a list of discharge medications.  Relevant Imaging Results:  Relevant Lab Results:   Additional Information SS# [place], [place]

## 2017-09-20 NOTE — Clinical Social Work Note (Signed)
Clinical Social Work Assessment  Patient Details  Name: Deanna Gonzales MRN: [id number] Date of Birth: 1928-04-05  Date of referral:  09/20/17               Reason for consult:  Facility Placement, Discharge Planning                Permission sought to share information with:  Facility Sport and exercise psychologist, Family Supports Permission granted to share information::  Yes, Verbal Permission Granted  Name::      Tammy Nifong/Lori Vinciguerra  Agency::     Relationship::   daughter/daughter in Financial trader Information:   [phone]  Housing/Transportation Living arrangements for the past 2 months:  [place] of Information:  Patient, Adult Children Patient Interpreter Needed:  None Criminal Activity/Legal Involvement Pertinent to Current Situation/Hospitalization:  No - Comment as needed Significant Relationships:  Adult Children, [place], Other Family Members Lives with:  Self Do you feel safe going back to the place where you live?  Yes Need for family participation in patient care:  Yes (Comment)(support with mobility; supervision)  Care giving concerns: Pt lives alone and has been having pain related to fracture.    Social Worker assessment / plan: CSW met with pt and pt daughter in law at bedside. Pt quiet but accepting, lives alone with family close by (3-5 minutes away). Pt generally walks with no assistance/occasionally a walker, but has recently been limited greatly by pain. Pt states that she is amenable to SNF placement to get stronger. CSW explained SNF process and that pt insurance may or may not cover SNF stay due to pt currently mobility status (150+ ft). Pt and pt daughter in law state understanding. Pt PASSR pending.   Employment status:  Retired Nurse, adult PT Recommendations:  [place], [place] / Referral to community resources:  [place]  Patient/Family's Response to  care:  Pt and pt family understand CSW role and SNF process.  Patient/Family's Understanding of and Emotional Response to Diagnosis, Current Treatment, and Prognosis: Pt and pt family understand diagnosis, current treatment and prognosis. Pt family state concern for pt pain level currently, as she sometimes hurts to the point of nausea. CSW provided supportive listening and will f/u to provide offers.     Emotional Assessment Appearance:  Appears stated age Attitude/Demeanor/Rapport:  Gracious Affect (typically observed):  Accepting, Appropriate, Quiet Orientation:  Oriented to Self, Oriented to Place, Oriented to  Time, Oriented to Situation Alcohol / Substance use:  Not Applicable Psych involvement (Current and /or in the community):  No (Comment)  Discharge Needs  Concerns to be addressed:  Care Coordination, Discharge Planning Concerns Readmission within the last 30 days:    Current discharge risk:  Lives alone, Physical Impairment Barriers to Discharge:  Continued Medical Work up, Ship broker, Programmer, applications ([place])   [place], [place] 09/20/2017, 3:35 PM

## 2017-09-20 NOTE — Social Work (Signed)
CSW completed assessment, additional contact person listed below (daughter in law)  Luci Bellucci ([place], [place] Work [phone]

## 2017-09-20 NOTE — Progress Notes (Signed)
Family Medicine Teaching Service Daily Progress Note Intern Pager: [phone]  Patient name: Deanna Gonzales Medical record number: [id number] Date of birth: 05-24-1928 Age: 84 y.o. Gender: female  Primary Care Provider: Manfred Shirts, PA Consultants: Orthopedic Surgery Code Status: full  Pt Overview and Major Events to Date:  2/13: Admitted 2/14: PT recc'd SNF, Dr. Lynann Bologna discussed rehab vs surgery next week w family  Assessment and Plan: Deanna Mashburnis a 84 y.o.femalepresenting with back pain following a recentlly diagnosed L4 compresion fracture. PMH is significant for A Fib (on warfarin), osteoporosis, hypothyroidism, HTN, and CKD.  Acute Back Pain 2/2 L4 Compression Fracture  Osteoporosis: Plan to titrate pain regimen with goal of control on PO meds; pt c/o back spasms this morning. Ortho discussed with patient options of SNF/rehab (with likely improvement in overall condition) vs possible kyphoplasty early next week if pain continues to be severe. PT recommends SNF.  - Ortho consulted; appreciate recs (please see their consult note 2/14) - Pain: acetaminophen 646m Q6H prn mild pain, percocet 7.5-325 Q6H prn moderate pain; d/c morphine - Start baclofen 534mbid prn back spasm - Contiue calcitonin nasal spray once daily x 2wks (2/14- ) - TLSO brace ordered - Check Vit D; may need 50,000IU weekly if deficient. In the interim, given eGFR>30, will initiate Ca 50066mnd Vit D 800 IU supplementation for osteoporosis treatment - SW consulted for possible SNF placement - f/u OT reccs - f/u PTH to determine if fragility fracture is secondary to CKD-BMD vs osteoporosis; if no hyperparathyroidism, can initiate bisphosphonate 6 wks post-fracture  Nausea/Vomiting: Pt with 2 episodes emesis and nausea after receiving pain medications. Likely med side effect in combination with poor appetite and pain in setting of benign abdominal exam and vitals wnl.  - ondansetron 4mg7mn Q6H prn nausea -  d/c lidocaine patch; unlikely to be causing nausea but pt feels may be related  Atrial Fibrillation: Stable. In NSR, HR 69. INR 2.21 2/15.  - warfarin per pharmacy - Continue home amiodarone 100mg55mly, diltiazem 24hr capsule 120mg 65my - Daily PT-INR  Hypertension: Chronic, at goal for age. Continue home meds.  - Triamterene-HCTZ 37.5-25mg every other day - Losartan 25mg B14mCKD III: Cr 1.3 2/14  (baseline 1.5-1.7).  - Avoid NSAIDS, nephrotoxic agents - Avoid bisphosphonates w Cr Cl <30-35  Anxiety: Chronic, stable - Continue home alprazolam 0.25mg BI26mn, mirtazapine 15mg qHS16mpothyroidism: Chronic, stable. Last TSH 3.5 03/2017.  - Continue home levothyroxine 88mcg dai52mFEN/GI:Heart healthy diet Prophylaxis:restart warfarin   Disposition: pending pain ctrl on po meds and PT/OT recs  Subjective:  Patient reports some nausea overnight, usually with administration of pain meds. She complains of continued lower back pain but that the pain medications help with this. She would like to bathe this morning.   Objective: Temp:  [98 F (36.7 C)-98.9 F (37.2 C)] 98 F (36.7 C) (02/15 0433) Pulse Rate:  [69-76] 69 (02/15 0433) Resp:  [17-18] 18 (02/15 0433) BP: (129-138)/(61-69) 137/69 (02/15 0433) SpO2:  [93 %-97 %] 93 % (02/15 0433) Physical Exam: General: resting comfortably in bed, NAD, sleepy Cardiovascular: RRR, no m/r/g, pacemaker palpated in R chest Respiratory: CTA b/l, normal WOB on RA Abdomen: nontender, nondistended, hyperative bowel sounds Extremities: WWP, 2+ pedal pulses, chronic venous stasis changes apparent. Sensation to light touch intact bilaterally. 5/5 plantar and dorsiflexion, 4+/5 bilateral LE strength.   Laboratory: Recent Labs  Lab 09/18/17 1142 09/18/17 1154 09/19/17 0533  WBC 12.5*  --  9.8  HGB 14.4 15.0 14.8  HCT 44.0 44.0 46.4*  PLT 309  --  232   Recent Labs  Lab 09/18/17 1154 09/19/17 0533  NA 143 141  K 3.9 3.8   CL 104 105  CO2  --  24  BUN 24* 22*  CREATININE 1.20* 1.31*  CALCIUM  --  9.0  GLUCOSE 81 85   PT-INR: 2.21  Imaging/Diagnostic Tests:   Dimple Casey, Medical Student 09/20/2017, 7:06 AM MS4, [place] Intern pager: [phone], text pages welcome  FPTS Upper-Level Resident Addendum  I have independently interviewed and examined the patient. I have discussed the above with the original author and agree with their documentation. My edits for correction/addition/clarification are in blue.  Hyman Bible, MD PGY-3, [place] Service pager: [phone] (text pages welcome through [facility])

## 2017-09-20 NOTE — Progress Notes (Signed)
MD gave a verbal order to administer po percocet earlier. Medication administered as ordered

## 2017-09-20 NOTE — Progress Notes (Signed)
Family Medicine Teaching Service Daily Progress Note Intern Pager: [phone]  Patient name: Deanna Gonzales Medical record number: [id number] Date of birth: [birth year] Age: 84 y.o. Gender: female  Primary Care Provider: Manfred Shirts, PA Consultants: Orthopedic Surgery Code Status: FULL  Pt Overview and Major Events to Date:  2/13: Admitted 2/14: PT recc'd SNF, Dr. Lynann Bologna discussed rehab vs surgery next week w family  Assessment and Plan: [name] a 84 y.o.femalepresenting with back pain following a recentlly diagnosed L4 compresion fracture. PMH is significant for A Fib (on warfarin), osteoporosis, hypothyroidism, HTN, and CKD.  Acute Back Pain 2/2 L4 Compression Fracture  Osteoporosis: Pain now controlled on PO meds alone. Ortho discussed with patient options of SNF/rehab (with likely improvement in overall condition) vs possible kyphoplasty early next week if pain continues to be severe. PT/OT recommends SNF. Vit D, PTH resulted both WNL. - Ortho consulted; appreciate recs  - Pain: acetaminophen 650mg  Q6H prn mild pain, percocet 7.5-325 Q6H prn moderate pain - Baclofen 5mg  bid prn back spasm - Contiue calcitonin nasal spray once daily x 2wks (2/14- ) - TLSO brace ordered - Continue Ca 500mg  and Vit D 800 IU supplementation for osteoporosis treatment - SW consulted for possible SNF placement - Initiate bisphosphonate 6 wks post-fracture  Nausea/Vomiting: Pt with 2 episodes emesis and nausea after receiving pain medications. Likely med side effect in combination with poor appetite and pain in setting of benign abdominal exam and vitals wnl. No additional episodes of N/V overnight.  - Ondansetron 4mg  Q6H prn nausea - Lidocaine patch discontinued 2/15; unlikely to be causing nausea but pt feels may be related  Atrial Fibrillation: Stable. In NSR, HR 69 2/16. INR 2.22 2/16.  - Warfarin per pharmacy - Continue home amiodarone 100mg  daily, diltiazem 24hr capsule 120mg   daily - Daily PT-INR  Hypertension: Chronic, at goal for age. BP 115/54 overnight.  - Continue home Triamterene-HCTZ 37.5-25mg  every other day and Losartan 25mg  BID  CKD III: Cr 1.3 2/14  (baseline 1.5-1.7).  - Avoid NSAIDS, nephrotoxic agents - Avoid bisphosphonates w/Cr Cl <30-35  Anxiety: Chronic, stable - Continue home alprazolam 0.25mg  BID prn, mirtazapine 15mg  qHS  Hypothyroidism: Chronic, stable. Last TSH 3.5 03/2017.  - Continue home levothyroxine 80mcg daily  FEN/GI:Heart healthy diet Prophylaxis: Warfarin  Disposition: pending pain ctrl on po meds and PT/OT recs  Subjective:  Patient doing well this AM. Says Percocet is controlling her pain well. No additional episodes of N/V.   Objective: Temp:  [98.1 F (36.7 C)-98.6 F (37 C)] 98.1 F (36.7 C) (02/16 0520) Pulse Rate:  [69-70] 69 (02/16 0520) Resp:  [17-18] 17 (02/16 0520) BP: (102-117)/(48-56) 115/54 (02/16 0520) SpO2:  [90 %-92 %] 90 % (02/16 0520) Physical Exam: General: resting comfortably in bed, NAD, sleepy Cardiovascular: RRR, no m/r/g, pacemaker palpated in R chest Respiratory: CTA b/l, normal WOB on RA Abdomen: nontender, nondistended, hyperative bowel sounds Extremities: WWP, 2+ pedal pulses, chronic venous stasis changes apparent. Sensation to light touch intact bilaterally. 5/5 plantar and dorsiflexion, 4+/5 bilateral LE strength.   Laboratory: Recent Labs  Lab 09/18/17 1142 09/18/17 1154 09/19/17 0533  WBC 12.5*  --  9.8  HGB 14.4 15.0 14.8  HCT 44.0 44.0 46.4*  PLT 309  --  232   Recent Labs  Lab 09/18/17 1154 09/19/17 0533  NA 143 141  K 3.9 3.8  CL 104 105  CO2  --  24  BUN 24* 22*  CREATININE 1.20* 1.31*  CALCIUM  --  9.0  GLUCOSE 81 85   PT-INR: 2.22 2/16  Imaging/Diagnostic Tests: No results found.  Verner Mould, MD 09/21/2017, 6:50 AM PGY-3, [place] Service pager: [phone] (text pages welcome through [facility])

## 2017-09-20 NOTE — Progress Notes (Signed)
[]  Hide copied text  [] Hover for details   Constant c/o of either pain or nausea despite being medicated. Now says she is getting spasms and even stated that the pain patch might be making her worse. Discussed using anti anxiety medication and pt agreeable

## 2017-09-21 LAB — PROTIME-INR
INR: 2.22
Prothrombin Time: 24.5 seconds — ABNORMAL HIGH (ref 11.4–15.2)

## 2017-09-21 LAB — PARATHYROID HORMONE, INTACT (NO CA): PTH: 40 pg/mL (ref 15–65)

## 2017-09-21 LAB — VITAMIN D 25 HYDROXY (VIT D DEFICIENCY, FRACTURES): VIT D 25 HYDROXY: 57.2 ng/mL (ref 30.0–100.0)

## 2017-09-21 MED ORDER — OXYCODONE HCL 5 MG PO TABS
5.0000 mg | ORAL_TABLET | ORAL | Status: AC
  Administered 2017-09-21: 5 mg via ORAL
  Filled 2017-09-21: qty 1

## 2017-09-21 MED ORDER — WARFARIN SODIUM 1 MG PO TABS
1.5000 mg | ORAL_TABLET | Freq: Once | ORAL | Status: AC
  Administered 2017-09-21: 1.5 mg via ORAL
  Filled 2017-09-21: qty 1

## 2017-09-21 MED ORDER — VITAMIN D 1000 UNITS PO TABS
1000.0000 [IU] | ORAL_TABLET | Freq: Every day | ORAL | Status: DC
  Administered 2017-09-21 – 2017-09-26 (×5): 1000 [IU] via ORAL
  Filled 2017-09-21 (×5): qty 1

## 2017-09-21 NOTE — Progress Notes (Signed)
Physical Therapy Treatment Patient Details Name: Deanna Gonzales MRN: [id number] DOB: 1927-10-11 Today's Date: 09/21/2017    History of Present Illness 84 y.o. female admitted 2/12 with low back pain that has been worsening over last 12 days. Found to have L4 compressive fracture, per Ortho MD will be awaiting progress over hospitalization before reassessing kyphoplasty vs non operative treatment.  PMH includes:  presence of permanent cardiac pacemaker, HTN, Osteoporosis, GERD, CKD stage 3.     PT Comments    Pt currently limited by nausea. She required min A for bed mobilities and transfers, min guard for gait with RW for 67ft. Pt with frequent stops during ambulation secondary to nausea. Will continue to follow acutely to maximize pt safety with mobility and functional independence.    Follow Up Recommendations  SNF;Supervision/Assistance - 24 hour     Equipment Recommendations  (TBD)    Recommendations for Other Services       Precautions / Restrictions Precautions Precautions: Fall Required Braces or Orthoses: (TLSO) Restrictions Weight Bearing Restrictions: No    Mobility  Bed Mobility Overal bed mobility: Needs Assistance Bed Mobility: Rolling;Sidelying to Sit Rolling: Supervision Sidelying to sit: Min assist       General bed mobility comments: min A and cues to elevate trunk. HOB elevated and use of bed rail  Transfers Overall transfer level: Needs assistance Equipment used: Rolling walker (2 wheeled) Transfers: Sit to/from Stand Sit to Stand: Min assist         General transfer comment: cues for hand placement. Min A to rise from EOB.  Ambulation/Gait Ambulation/Gait assistance: Min guard;Min assist Ambulation Distance (Feet): 75 Feet Assistive device: Rolling walker (2 wheeled) Gait Pattern/deviations: Step-through pattern;Decreased stride length Gait velocity: decreased Gait velocity interpretation: Below normal speed for age/gender General Gait  Details: Pt with good posture and overall steady gait. Frequent pauses during ambulation. Pt c/o nausea limiting distance.    Stairs            Wheelchair Mobility    Modified Rankin (Stroke Patients Only)       Balance Overall balance assessment: Needs assistance Sitting-balance support: No upper extremity supported;Feet supported Sitting balance-Leahy Scale: Fair     Standing balance support: No upper extremity supported;During functional activity Standing balance-Leahy Scale: Fair                              Cognition Arousal/Alertness: Awake/alert Behavior During Therapy: WFL for tasks assessed/performed Overall Cognitive Status: Within Functional Limits for tasks assessed                                        Exercises      General Comments        Pertinent Vitals/Pain Pain Assessment: Faces Faces Pain Scale: Hurts little more Pain Location: lumbar  Pain Descriptors / Indicators: Aching;Discomfort;Grimacing Pain Intervention(s): Monitored during session;Limited activity within patient's tolerance    Home Living                      Prior Function            PT Goals (current goals can now be found in the care plan section) Acute Rehab PT Goals Patient Stated Goal: return home PT Goal Formulation: With patient/family Time For Goal Achievement: 09/26/17 Potential to Achieve Goals: Fair Progress towards PT  goals: Progressing toward goals    Frequency    Min 5X/week      PT Plan Current plan remains appropriate    Co-evaluation              AM-PAC PT "6 Clicks" Daily Activity  Outcome Measure  Difficulty turning over in bed (including adjusting bedclothes, sheets and blankets)?: Unable Difficulty moving from lying on back to sitting on the side of the bed? : Unable Difficulty sitting down on and standing up from a chair with arms (e.g., wheelchair, bedside commode, etc,.)?: Unable Help needed  moving to and from a bed to chair (including a wheelchair)?: A Little Help needed walking in hospital room?: A Little Help needed climbing 3-5 steps with a railing? : A Little 6 Click Score: 12    End of Session Equipment Utilized During Treatment: Gait belt;Back brace Activity Tolerance: Other (comment)(limited by nausea) Patient left: with call bell/phone within reach;with family/visitor present;in bed Nurse Communication: Mobility status PT Visit Diagnosis: Unsteadiness on feet (R26.81);Other abnormalities of gait and mobility (R26.89);Muscle weakness (generalized) (M62.81);Pain Pain - Right/Left: (Lumbar) Pain - part of body: (Back)     Time: 6759-1638 PT Time Calculation (min) (ACUTE ONLY): 21 min  Charges:  $Gait Training: 8-22 mins                    G Codes:       Benjiman Core, [place] Pager [phone] Acute Rehab   Allena Katz 09/21/2017, 3:09 PM

## 2017-09-21 NOTE — Clinical Social Work Note (Signed)
CSW provided patient with list of bed offers. She stated she "sort of" remembers SNF conversation with CSW. She gave CSW permission to call her son. CSW called and provided bed offers to him and his wife. CSW will email them SNF list which includes scores for facilities. [facility]. CSW sent referral to them. [facility] will likely be second preference. PASARR still under review. Patient cannot discharge to SNF until PASARR obtained.  Dayton Scrape, [place]

## 2017-09-21 NOTE — Progress Notes (Signed)
ANTICOAGULATION CONSULT NOTE - Follow Up Consult  Pharmacy Consult for Coumadin Indication: atrial fibrillation  Allergies  Allergen Reactions  . Flexeril [Cyclobenzaprine] Anaphylaxis  . Vortioxetine Anaphylaxis  . Duloxetine Other (See Comments)    hypertension hypertension   . Lisinopril Other (See Comments)    unknown unknown   . Risedronate Other (See Comments)    arthralgia  . Risedronate Sodium Other (See Comments)    arthralgia arthralgia  . Adhesive [Tape] Rash    Gets red and welts  . Alendronate Nausea Only    Patient Measurements: Height: 5\' 8"  (172.7 cm) Weight: 155 lb (70.3 kg) IBW/kg (Calculated) : 63.9  Vital Signs: Temp: 98.1 F (36.7 C) (02/16 0520) Temp Source: Oral (02/16 0520) BP: 115/54 (02/16 0520) Pulse Rate: 69 (02/16 0520)  Labs: Recent Labs    09/18/17 1142 09/18/17 1154  09/19/17 0533 09/20/17 0637 09/21/17 0400  HGB 14.4 15.0  --  14.8  --   --   HCT 44.0 44.0  --  46.4*  --   --   PLT 309  --   --  232  --   --   LABPROT  --   --    < > 24.1* 24.3* 24.5*  INR  --   --    < > 2.18 2.21 2.22  CREATININE  --  1.20*  --  1.31*  --   --    < > = values in this interval not displayed.    Estimated Creatinine Clearance: 30.5 mL/min (A) (by C-G formula based on SCr of 1.31 mg/dL (H)).  Assessment: 84 year old female on chronic Coumadin for atrial fibrillation. Pharmacy consulted to resume inpatient.   INR today is 2.22, therapeutic after receiving home dose last night. No new significant drug interactions noted. Plan is conservative management of back compression fracture for now.   Home regimen was 3 mg daily except 1.5mg  on Saturday.   Goal of Therapy:  INR 2-3 Monitor platelets by anticoagulation protocol: Yes   Plan:  Coumadin 1.5mg  po x1 tonight per home dose Daily PT/INR Follow-up if surgical plans change and need to hold Coumadin  Sloan Leiter, PharmD, BCPS, BCCCP Clinical Pharmacist Clinical phone 09/21/2017  until 3:30PM - #[phone] After hours, please call [phone] 09/21/2017,7:03 AM

## 2017-09-22 LAB — PROTIME-INR
INR: 2.31
PROTHROMBIN TIME: 25.2 s — AB (ref 11.4–15.2)

## 2017-09-22 MED ORDER — HYDROCHLOROTHIAZIDE 25 MG PO TABS: 25.0000 mg | ORAL_TABLET | Freq: Every day | ORAL | Status: DC

## 2017-09-22 MED ORDER — LOSARTAN POTASSIUM 50 MG PO TABS: 25.0000 mg | ORAL_TABLET | Freq: Two times a day (BID) | ORAL | Status: DC

## 2017-09-22 MED ORDER — MIRTAZAPINE 15 MG PO TABS: 15.0000 mg | ORAL_TABLET | Freq: Every day | ORAL | Status: DC

## 2017-09-22 MED ORDER — GI COCKTAIL ~~LOC~~
30.0000 mL | Freq: Two times a day (BID) | ORAL | Status: DC | PRN
  Filled 2017-09-22: qty 30

## 2017-09-22 MED ORDER — OXYCODONE-ACETAMINOPHEN 5-325 MG PO TABS: 1.0000 | ORAL_TABLET | Freq: Three times a day (TID) | ORAL | Status: DC | PRN

## 2017-09-22 MED ORDER — SENNOSIDES-DOCUSATE SODIUM 8.6-50 MG PO TABS: 1.0000 | ORAL_TABLET | Freq: Two times a day (BID) | ORAL | Status: DC | PRN

## 2017-09-22 MED ORDER — BACLOFEN 10 MG PO TABS
5.0000 mg | ORAL_TABLET | Freq: Three times a day (TID) | ORAL | Status: DC
  Administered 2017-09-22 – 2017-09-26 (×11): 5 mg via ORAL
  Filled 2017-09-22 (×11): qty 1

## 2017-09-22 MED ORDER — LEVOTHYROXINE SODIUM 100 MCG PO TABS: 100.0000 ug | ORAL_TABLET | Freq: Every day | ORAL | Status: DC

## 2017-09-22 MED ORDER — ALPRAZOLAM 0.25 MG PO TABS
0.2500 mg | ORAL_TABLET | Freq: Every day | ORAL | Status: DC
  Administered 2017-09-22 – 2017-09-25 (×4): 0.25 mg via ORAL
  Filled 2017-09-22 (×4): qty 1

## 2017-09-22 MED ORDER — OXYCODONE HCL 5 MG PO TABS
5.0000 mg | ORAL_TABLET | Freq: Four times a day (QID) | ORAL | Status: DC
  Administered 2017-09-22 – 2017-09-26 (×11): 5 mg via ORAL
  Filled 2017-09-22 (×13): qty 1

## 2017-09-22 MED ORDER — DILTIAZEM HCL 60 MG PO TABS: 120.0000 mg | ORAL_TABLET | Freq: Every day | ORAL | Status: DC

## 2017-09-22 MED ORDER — POLYETHYLENE GLYCOL 3350 17 G PO PACK
17.0000 g | PACK | Freq: Every day | ORAL | Status: DC
  Administered 2017-09-22 – 2017-09-23 (×2): 17 g via ORAL
  Filled 2017-09-22 (×2): qty 1

## 2017-09-22 NOTE — Progress Notes (Signed)
Family Medicine Teaching Service Daily Progress Note Intern Pager: [phone]  Patient name: Deanna Gonzales Medical record number: [id number] Date of birth: 1928-01-19 Age: 84 y.o. Gender: female  Primary Care Provider: Manfred Shirts, PA Consultants: Orthopedic Surgery Code Status: FULL  Pt Overview and Major Events to Date:  2/13: Admitted 2/14: PT recc'd SNF, Dr. Lynann Bologna discussed rehab vs surgery next week w family  Assessment and Plan: Deanna Mashburnis a 84 y.o.femalepresenting with back pain following a recentlly diagnosed L4 compresion fracture. PMH is significant for A Fib (on warfarin), osteoporosis, hypothyroidism, HTN, and CKD.  Acute Back Pain 2/2 L4 Compression Fracture  Osteoporosis: Pain now controlled on PO meds alone. - Ortho consulted; appreciate recs. Plan to reassess 2/18 for kyphoplasty vs medical management - Start scheduled Oxycodone 5mg  q 6 hrs, with 5mg  q3hrs prn  - Continue Tylenol 650mg  q6hrs scheduled - Increase Baclofen from bid to tid - Contiue calcitonin nasal spray once daily x 2wks (2/14- ) - Add miralax and senokot-s for bowel regimen - Zofran prn for nausea - TLSO brace ordered - Continue Ca 500mg  and Vit D 800 IU supplementation for osteoporosis treatment - Plan to start bisphosphonate 6 wks post-fracture  Paroxysmal Atrial Fibrillation: Stable. Remains in NSR. INR 2.31 this morning - Change warfarin to heparin in anticipation of possible surgery this week - Continue home amiodarone 100mg  daily, diltiazem 24hr capsule 120mg  daily - Daily PT-INR  Hypertension: Chronic. Normotensive today. - Continue home Triamterene-HCTZ 37.5-25mg  every other day and Losartan 25mg  BID  CKD III: Cr 1.3 2/14  (baseline 1.5-1.7).  - Avoid NSAIDS, nephrotoxic agents - Avoid bisphosphonates w/Cr Cl <30-35  Anxiety: Chronic, stable - Continue home alprazolam 0.25mg  BID prn, mirtazapine 15mg  qHS  Hypothyroidism: Chronic, stable. Last TSH 3.5 03/2017.  -  Continue home levothyroxine 67mcg daily  FEN/GI:Heart healthy diet Prophylaxis: Change warfarin to heparin in anticipation of possible surgery this week.  Disposition: pending re-evaluation by ortho 2/18. Likely to SNF on 2/18 or 2/19 if no intervention planned.  Subjective:  Patient states she continues to have pain this morning. The pain medications are helping. She has been sitting up in the chair for the last two hours and is feeling fine. Her nausea and vomiting have resolved.   Objective: Temp:  [97.3 F (36.3 C)-98.2 F (36.8 C)] 98.2 F (36.8 C) (02/17 0456) Pulse Rate:  [69-72] 69 (02/17 0456) Resp:  [17-19] 19 (02/17 0456) BP: (121-129)/(52-56) 129/52 (02/17 0456) SpO2:  [94 %-96 %] 94 % (02/17 0456) Physical Exam: General: sitting up in chair, alert, in NAD, TLSO brace in place Cardiovascular: RRR, no murmurs Respiratory: CTAB, normal work of breathing Abdomen: not examined due to TLSO brace Extremities: warm and well-perfused, no edema  Laboratory: Recent Labs  Lab 09/18/17 1142 09/18/17 1154 09/19/17 0533  WBC 12.5*  --  9.8  HGB 14.4 15.0 14.8  HCT 44.0 44.0 46.4*  PLT 309  --  232   Recent Labs  Lab 09/18/17 1154 09/19/17 0533  NA 143 141  K 3.9 3.8  CL 104 105  CO2  --  24  BUN 24* 22*  CREATININE 1.20* 1.31*  CALCIUM  --  9.0  GLUCOSE 81 85   PT-INR: 2.22 2/16  Imaging/Diagnostic Tests: No results found.  Mayo, Pete Pelt, MD 09/22/2017, 9:38 AM PGY-3, [place] Service pager: [phone] (text pages welcome through [facility])

## 2017-09-22 NOTE — Progress Notes (Signed)
ANTICOAGULATION CONSULT NOTE - Follow Up Consult  Pharmacy Consult for Coumadin > IV heparin Indication: atrial fibrillation  Allergies  Allergen Reactions  . Flexeril [Cyclobenzaprine] Anaphylaxis  . Vortioxetine Anaphylaxis  . Duloxetine Other (See Comments)    hypertension hypertension   . Lisinopril Other (See Comments)    unknown unknown   . Risedronate Other (See Comments)    arthralgia  . Risedronate Sodium Other (See Comments)    arthralgia arthralgia  . Adhesive [Tape] Rash    Gets red and welts  . Alendronate Nausea Only    Patient Measurements: Height: 5\' 8"  (172.7 cm) Weight: 155 lb (70.3 kg) IBW/kg (Calculated) : 63.9  Vital Signs: Temp: 98.2 F (36.8 C) (02/17 0456) BP: 129/52 (02/17 0456) Pulse Rate: 69 (02/17 0456)  Labs: Recent Labs    09/20/17 0637 09/21/17 0400 09/22/17 0334  LABPROT 24.3* 24.5* 25.2*  INR 2.21 2.22 2.31    Estimated Creatinine Clearance: 30.5 mL/min (A) (by C-G formula based on SCr of 1.31 mg/dL (H)).  Assessment: 84 year old female on chronic warfarin for atrial fibrillation. Now plans for possible surgery this week, so warfarin to be held and IV heparin to start when INR <2.   INR today is 2.31- received warfarin dose last evening and INR has been stable. Doubt it will trend down quickly.  Home regimen was 3 mg daily except 1.5mg  on Saturday.   Goal of Therapy:  INR 2-3 Monitor platelets by anticoagulation protocol: Yes   Plan:  Hold warfarin Start IV heparin when INR <2 Daily INR  Meira Wahba D. Cortnie Ringel, PharmD, BCPS Clinical Pharmacist Clinical Phone for 09/22/2017 until 3:30pm: [phone] If after 3:30pm, please call main pharmacy at [phone] 09/22/2017 1:17 PM

## 2017-09-23 LAB — CBC
HCT: 41.8 % (ref 36.0–46.0)
HEMOGLOBIN: 13.4 g/dL (ref 12.0–15.0)
MCH: 32.1 pg (ref 26.0–34.0)
MCHC: 32.1 g/dL (ref 30.0–36.0)
MCV: 100 fL (ref 78.0–100.0)
PLATELETS: 231 10*3/uL (ref 150–400)
RBC: 4.18 MIL/uL (ref 3.87–5.11)
RDW: 13.8 % (ref 11.5–15.5)
WBC: 8.9 10*3/uL (ref 4.0–10.5)

## 2017-09-23 LAB — BASIC METABOLIC PANEL
ANION GAP: 9 (ref 5–15)
BUN: 26 mg/dL — ABNORMAL HIGH (ref 6–20)
CALCIUM: 8.8 mg/dL — AB (ref 8.9–10.3)
CO2: 26 mmol/L (ref 22–32)
CREATININE: 1.43 mg/dL — AB (ref 0.44–1.00)
Chloride: 102 mmol/L (ref 101–111)
GFR, EST AFRICAN AMERICAN: 37 mL/min — AB (ref >60)
GFR, EST NON AFRICAN AMERICAN: 32 mL/min — AB (ref >60)
Glucose, Bld: 97 mg/dL (ref 65–99)
Potassium: 4.2 mmol/L (ref 3.5–5.1)
SODIUM: 137 mmol/L (ref 135–145)

## 2017-09-23 LAB — SURGICAL PCR SCREEN
MRSA, PCR: NEGATIVE
STAPHYLOCOCCUS AUREUS: NEGATIVE

## 2017-09-23 LAB — PROTIME-INR
INR: 2.35
PROTHROMBIN TIME: 25.5 s — AB (ref 11.4–15.2)

## 2017-09-23 NOTE — Progress Notes (Signed)
Physical Therapy Treatment Patient Details Name: Peggyann Zwiefelhofer MRN: [id number] DOB: 1927/10/22 Today's Date: 09/23/2017    History of Present Illness 84 y.o. female admitted 2/12 with low back pain that has been worsening over last 12 days. Found to have L4 compressive fracture, per Ortho MD will be awaiting progress over hospitalization before reassessing kyphoplasty vs non operative treatment.  PMH includes:  presence of permanent cardiac pacemaker, HTN, Osteoporosis, GERD, CKD stage 3.     PT Comments    Patient tolerated ambulating 2ft with min guard assist and RW. Pt continues to require most assistance with bed mobility due to pain. Continue to progress as tolerated.    Follow Up Recommendations  SNF;Supervision/Assistance - 24 hour     Equipment Recommendations  (TBD)    Recommendations for Other Services       Precautions / Restrictions Precautions Precautions: Fall Required Braces or Orthoses: (TLSO) Restrictions Weight Bearing Restrictions: No    Mobility  Bed Mobility Overal bed mobility: Needs Assistance Bed Mobility: Rolling;Sit to Sidelying Rolling: Supervision       Sit to sidelying: Min assist General bed mobility comments: assist to bring bilat LE into bed; cues for sequencing and technique  Transfers Overall transfer level: Needs assistance Equipment used: Rolling walker (2 wheeled) Transfers: Sit to/from Stand Sit to Stand: Min assist         General transfer comment: assist to power up from recliner; cues for safe hand placement  Ambulation/Gait Ambulation/Gait assistance: Min guard Ambulation Distance (Feet): 80 Feet Assistive device: Rolling walker (2 wheeled) Gait Pattern/deviations: Step-through pattern;Decreased stride length Gait velocity: decreased   General Gait Details: cues for posture   Stairs            Wheelchair Mobility    Modified Rankin (Stroke Patients Only)       Balance Overall balance assessment:  Needs assistance Sitting-balance support: No upper extremity supported;Feet supported Sitting balance-Leahy Scale: Fair     Standing balance support: No upper extremity supported;During functional activity Standing balance-Leahy Scale: Fair                              Cognition Arousal/Alertness: Awake/alert Behavior During Therapy: WFL for tasks assessed/performed Overall Cognitive Status: Within Functional Limits for tasks assessed                                        Exercises      General Comments General comments (skin integrity, edema, etc.): TLSO doffed in sitting EOB prior to return to supine due to discomfort when supine with brace donned       Pertinent Vitals/Pain Pain Assessment: Faces Faces Pain Scale: Hurts even more(4 while ambulating; 6 during transitional movements) Pain Location: back Pain Descriptors / Indicators: Aching;Grimacing;Guarding;Sore Pain Intervention(s): Limited activity within patient's tolerance;Monitored during session;Premedicated before session;Repositioned    Home Living                      Prior Function            PT Goals (current goals can now be found in the care plan section) Acute Rehab PT Goals Patient Stated Goal: return home PT Goal Formulation: With patient/family Time For Goal Achievement: 09/26/17 Potential to Achieve Goals: Fair Progress towards PT goals: Progressing toward goals    Frequency  Min 5X/week      PT Plan Current plan remains appropriate    Co-evaluation              AM-PAC PT "6 Clicks" Daily Activity  Outcome Measure  Difficulty turning over in bed (including adjusting bedclothes, sheets and blankets)?: Unable Difficulty moving from lying on back to sitting on the side of the bed? : Unable Difficulty sitting down on and standing up from a chair with arms (e.g., wheelchair, bedside commode, etc,.)?: Unable Help needed moving to and from a bed  to chair (including a wheelchair)?: A Little Help needed walking in hospital room?: A Little Help needed climbing 3-5 steps with a railing? : A Little 6 Click Score: 12    End of Session Equipment Utilized During Treatment: Gait belt;Back brace Activity Tolerance: Patient tolerated treatment well Patient left: with call bell/phone within reach;with family/visitor present;in bed;with SCD's reapplied Nurse Communication: Mobility status PT Visit Diagnosis: Unsteadiness on feet (R26.81);Other abnormalities of gait and mobility (R26.89);Muscle weakness (generalized) (M62.81);Pain Pain - Right/Left: (Lumbar) Pain - part of body: (Back)     Time: 0923-1000 PT Time Calculation (min) (ACUTE ONLY): 37 min  Charges:  $Gait Training: 8-22 mins $Therapeutic Activity: 8-22 mins                    G Codes:       Earney Navy, PTA Pager: [phone]     Darliss Cheney 09/23/2017, 10:20 AM

## 2017-09-23 NOTE — Progress Notes (Signed)
Occupational Therapy Treatment Patient Details Name: Deanna Gonzales MRN: [id number] DOB: 20-Apr-1928 Today's Date: 09/23/2017    History of present illness 84 y.o. female admitted 2/12 with low back pain that has been worsening over last 12 days. Found to have L4 compressive fracture, per Ortho MD will be awaiting progress over hospitalization before reassessing kyphoplasty vs non operative treatment.  PMH includes:  presence of permanent cardiac pacemaker, HTN, Osteoporosis, GERD, CKD stage 3.    OT comments  Pt demonstrating progress toward OT goals today. She was able to complete LB dressing tasks with min guard assist this session. She required redirection at times to follow multi-step commands this session. Pt does continue to require assistance with ADL and functional mobility and accordingly updated D/C recommendation to SNF. Will continue to follow and update recommendations post-operatively.    Follow Up Recommendations  SNF;Supervision/Assistance - 24 hour(will also re-assess post-operatively)    Equipment Recommendations  3 in 1 bedside commode    Recommendations for Other Services      Precautions / Restrictions Precautions Precautions: Fall;Back Required Braces or Orthoses: (TLSO) Restrictions Weight Bearing Restrictions: No       Mobility Bed Mobility Overal bed mobility: Needs Assistance Bed Mobility: Rolling;Sit to Sidelying         Sit to sidelying: Min assist General bed mobility comments: Max cues for technique. HOB elevated. Assist to return LE to bed.   Transfers Overall transfer level: Needs assistance Equipment used: Rolling walker (2 wheeled) Transfers: Sit to/from Stand Sit to Stand: Min assist         General transfer comment: assist to power up from recliner; cues for safe hand placement    Balance Overall balance assessment: Needs assistance Sitting-balance support: No upper extremity supported;Feet supported Sitting balance-Leahy Scale:  Fair     Standing balance support: No upper extremity supported;During functional activity Standing balance-Leahy Scale: Fair Standing balance comment: can stand without UE support for static tasks                           ADL either performed or assessed with clinical judgement   ADL Overall ADL's : Needs assistance/impaired     Grooming: Min guard;Standing               Lower Body Dressing: Min guard;Sit to/from Archivist: Ambulation;BSC;RW;Minimal Print production planner Details (indicate cue type and reason): BSC over toilet Toileting- Clothing Manipulation and Hygiene: Set up;Supervision/safety;Sitting/lateral lean       Functional mobility during ADLs: Minimal assistance;Rolling walker General ADL Comments: Pt able to complete multiple consecutive ADL this session.      Vision       Perception     Praxis      Cognition Arousal/Alertness: Awake/alert Behavior During Therapy: WFL for tasks assessed/performed Overall Cognitive Status: Impaired/Different from baseline Area of Impairment: Following commands                       Following Commands: Follows multi-step commands inconsistently       General Comments: Confused at times and requiring redirection to complete task requested of her.         Exercises     Shoulder Instructions       General Comments      Pertinent Vitals/ Pain       Pain Assessment: Faces Faces Pain Scale: Hurts even more Pain Location: back Pain Descriptors / Indicators:  Aching;Grimacing;Guarding;Sore Pain Intervention(s): Monitored during session;Limited activity within patient's tolerance;Repositioned  Home Living                                          Prior Functioning/Environment              Frequency  Min 2X/week        Progress Toward Goals  OT Goals(current goals can now be found in the care plan section)  Progress towards OT goals:  Progressing toward goals  Acute Rehab OT Goals Patient Stated Goal: return home OT Goal Formulation: With patient Time For Goal Achievement: 10/04/17 Potential to Achieve Goals: Good  Plan Discharge plan needs to be updated    Co-evaluation                 AM-PAC PT "6 Clicks" Daily Activity     Outcome Measure   Help from another person eating meals?: None Help from another person taking care of personal grooming?: A Little Help from another person toileting, which includes using toliet, bedpan, or urinal?: A Little Help from another person bathing (including washing, rinsing, drying)?: A Little Help from another person to put on and taking off regular upper body clothing?: A Little Help from another person to put on and taking off regular lower body clothing?: A Little 6 Click Score: 19    End of Session Equipment Utilized During Treatment: Rolling walker  OT Visit Diagnosis: Unsteadiness on feet (R26.81);Pain Pain - part of body: (back)   Activity Tolerance Patient tolerated treatment well   Patient Left with call bell/phone within reach;with family/visitor present;in bed   Nurse Communication          Time: 2297-9892 OT Time Calculation (min): 24 min  Charges: OT General Charges $OT Visit: 1 Visit OT Treatments $Self Care/Home Management : 23-37 mins  Norman Herrlich, MS OTR/L  Pager: [place] 09/23/2017, 5:06 PM

## 2017-09-23 NOTE — Progress Notes (Signed)
Patient continues to have ongoing pain. It is the same as it was 2 weeks ago, mainly increased with transitions. Pain is described as a stabbing pain, very much limiting her ability to walk.  Discussed a kyphoplasty in detail with the patient and her family. Discussed risks including infection, neurologic injury, cement extravasation, and lack of resolution of back pain.They do wish to proceed. I will discuss timing with OR - likely will be able to proceed Wednesday or Thursday. Patient will need to be NPO prior to surgery and will need anticoagulation held. Will provide details on timing once they are known, hopefully later today.

## 2017-09-23 NOTE — Progress Notes (Signed)
Of note, we will proceed with an L4 kyphoplasty procedure on Wednesday at approximately 10AM. Please discontinue anticoagulation prior to surgery,  and please make the patient NPO at midnight the evening prior to surgery.

## 2017-09-23 NOTE — Progress Notes (Signed)
Family Medicine Teaching Service Daily Progress Note Intern Pager: [phone]  Patient name: Deanna Gonzales Medical record number: [id number] Date of birth: [birth year] Age: 84 y.o. Gender: female  Primary Care Provider: Manfred Shirts, PA Consultants: Orthopedic Surgery Code Status: full  Pt Overview and Major Events to Date:  2/13: Admitted 2/14: PT recc'd SNF, Dr. Lynann Bologna discussed rehab vs surgery next week w family 2/18: Re-eval by orthopedic surgery, plan surgery 2/20  Assessment and Plan: Deanna Mashburnis a 84 y.o.femalepresenting with back pain following a recently diagnosed L4 compresion fracture.PMH is significant for A Fib (on warfarin), osteoporosis, hypothyroidism, HTN, and CKD.  Acute Back Pain 2/2 L4 Compression Fracture  Osteoporosis:Pain now controlled on PO meds alone. - Ortho consulted; appreciate recs. Plan for kyphoplasty 2/20 ~10AM. Plan for pt to be NPO at midnight and hold anticoag   - Continue scheduled Oxycodone 5mg  Q6H, w 5mg  Q3H prn  - Continue Tylenol 650mg  Q6H scheduled - Continue baclofen 5mg  TID - Contiue calcitonin nasal spray once daily x 2wks (2/14- ) - Zofran prn nausea - TLSO brace  - Continue Ca 500mg  and Vit D 800 IU supplementation for osteoporosis treatment - Plan to start bisphosphonate 6 wks post-fracture if renal function permits  Paroxysmal Atrial Fibrillation: Stable. Remains in NSR. INR 2.35 this morning - Continue home amiodarone 100mg  daily, diltiazem 24hr capsule 120mg  daily - hold warfarin pending surgery, heparin per pharmacy and will plan to hold at midnight 2/19 -Daily PT-INR  Hypertension: Chronic. Normotensive today, systolics in 147W and diastolic 29-56O.  - Continue home Triamterene-HCTZ 37.5-25mg every other day  - consider transition Losartan 25mg  BID to 25mg  once daily  CKD III:Cr 1.43 2/14  (baseline 1.5-1.7).  - Avoid NSAIDS, nephrotoxic agents - Avoid bisphosphonates w/Cr Cl <30-35  Anxiety: Chronic,  stable - Continue home alprazolam 0.25mg  BID prn, mirtazapine 15mg  qHS  Hypothyroidism: Chronic, stable. Last TSH 3.5 03/2017.  - Continue levothyroxine 149mcg daily (home dose 88 mcg)  FEN/GI:Heart healthy diet Prophylaxis: Change warfarin to heparin in anticipation of possible surgery this week.  Disposition:pending kyphoplasty  Subjective:  Pt reports feeling a little sleepy this morning   Objective: Temp:  [97.7 F (36.5 C)-98.5 F (36.9 C)] 98.3 F (36.8 C) (02/18 0529) Pulse Rate:  [69-71] 69 (02/18 0529) Resp:  [17] 17 (02/18 0529) BP: (112-122)/(58-69) 122/69 (02/18 0529) SpO2:  [90 %-93 %] 93 % (02/18 0529) Physical Exam: General: lying in bed, in  Neuro: awake and alert Extremities: moving all limbs equally, able to walk and change positions with assistance due to pain   Laboratory: Recent Labs  Lab 09/18/17 1142 09/18/17 1154 09/19/17 0533 09/23/17 0300  WBC 12.5*  --  9.8 8.9  HGB 14.4 15.0 14.8 13.4  HCT 44.0 44.0 46.4* 41.8  PLT 309  --  232 231   Recent Labs  Lab 09/18/17 1154 09/19/17 0533 09/23/17 0300  NA 143 141 137  K 3.9 3.8 4.2  CL 104 105 102  CO2  --  24 26  BUN 24* 22* 26*  CREATININE 1.20* 1.31* 1.43*  CALCIUM  --  9.0 8.8*  GLUCOSE 81 85 97     Imaging/Diagnostic Tests: none  Deanna Gonzales, Medical Student 09/23/2017, 9:53 AM MS4, [place] Intern pager: [phone], text pages welcome  FPTS Upper-Level Resident Addendum  I have independently interviewed and examined the patient. I have discussed the above with the original author and agree with their documentation.  Please see also any attending notes.  Bufford Lope, DO PGY-2, [facility] 09/23/2017 6:37 PM  FPTS Service pager: [phone] (text pages welcome through [place])

## 2017-09-23 NOTE — Social Work (Signed)
CSW met with pt family at bedside, they are exploring SNF options and would like CSW to reach out to [facility]. CSW has followed up with [facility].   CSW continuing to follow.   Alexander Mt, [place] Work [phone]

## 2017-09-23 NOTE — Care Management Important Message (Signed)
Important Message  Patient Details  Name: Deanna Gonzales MRN: [id number] Date of Birth: 1928-01-13   Medicare Important Message Given:  Yes    Deanna Gonzales 09/23/2017, 1:08 PM

## 2017-09-23 NOTE — Progress Notes (Signed)
ANTICOAGULATION CONSULT NOTE - Follow Up Consult  Pharmacy Consult:  IV heparin Indication: atrial fibrillation  Allergies  Allergen Reactions  . Flexeril [Cyclobenzaprine] Anaphylaxis  . Vortioxetine Anaphylaxis  . Duloxetine Other (See Comments)    hypertension hypertension   . Lisinopril Other (See Comments)    unknown unknown   . Risedronate Other (See Comments)    arthralgia  . Risedronate Sodium Other (See Comments)    arthralgia arthralgia  . Adhesive [Tape] Rash    Gets red and welts  . Alendronate Nausea Only    Patient Measurements: Height: 5\' 8"  (172.7 cm) Weight: 155 lb (70.3 kg) IBW/kg (Calculated) : 63.9  Vital Signs: Temp: 98.3 F (36.8 C) (02/18 0529) BP: 122/69 (02/18 0529) Pulse Rate: 69 (02/18 0529)  Labs: Recent Labs    09/21/17 0400 09/22/17 0334 09/23/17 0300  HGB  --   --  13.4  HCT  --   --  41.8  PLT  --   --  231  LABPROT 24.5* 25.2* 25.5*  INR 2.22 2.31 2.35  CREATININE  --   --  1.43*    Estimated Creatinine Clearance: 28 mL/min (A) (by C-G formula based on SCr of 1.43 mg/dL (H)).   Assessment: 60 YOF on chronic warfarin for atrial fibrillation.  Pharmacy consulted to transition to IV heparin when INR < 2 due to plan for kyphoplasty.  INR currently therapeutic; no bleeding reported.   Goal of Therapy:  INR 2-3 Monitor platelets by anticoagulation protocol: Yes     Plan:  Daily PT / INR Start IV heparin when INR <2 May need low dose Vitamin K   Patrece Tallie D. Mina Marble, PharmD, BCPS Pager:  [phone] 09/23/2017, 12:21 PM

## 2017-09-24 LAB — PROTIME-INR
INR: 1.78
Prothrombin Time: 20.6 seconds — ABNORMAL HIGH (ref 11.4–15.2)

## 2017-09-24 LAB — BASIC METABOLIC PANEL
ANION GAP: 10 (ref 5–15)
BUN: 20 mg/dL (ref 6–20)
CALCIUM: 9 mg/dL (ref 8.9–10.3)
CO2: 26 mmol/L (ref 22–32)
Chloride: 103 mmol/L (ref 101–111)
Creatinine, Ser: 1.35 mg/dL — ABNORMAL HIGH (ref 0.44–1.00)
GFR calc Af Amer: 40 mL/min — ABNORMAL LOW (ref >60)
GFR calc non Af Amer: 34 mL/min — ABNORMAL LOW (ref >60)
Glucose, Bld: 85 mg/dL (ref 65–99)
POTASSIUM: 3.9 mmol/L (ref 3.5–5.1)
Sodium: 139 mmol/L (ref 135–145)

## 2017-09-24 LAB — CBC
HEMATOCRIT: 42.9 % (ref 36.0–46.0)
Hemoglobin: 14.1 g/dL (ref 12.0–15.0)
MCH: 32.9 pg (ref 26.0–34.0)
MCHC: 32.9 g/dL (ref 30.0–36.0)
MCV: 100 fL (ref 78.0–100.0)
Platelets: 217 10*3/uL (ref 150–400)
RBC: 4.29 MIL/uL (ref 3.87–5.11)
RDW: 14.2 % (ref 11.5–15.5)
WBC: 9.8 10*3/uL (ref 4.0–10.5)

## 2017-09-24 LAB — HEPARIN LEVEL (UNFRACTIONATED): Heparin Unfractionated: 0.28 IU/mL — ABNORMAL LOW (ref 0.30–0.70)

## 2017-09-24 MED ORDER — HEPARIN (PORCINE) IN NACL 100-0.45 UNIT/ML-% IJ SOLN
1000.0000 [IU]/h | INTRAMUSCULAR | Status: DC
  Administered 2017-09-24: 1000 [IU]/h via INTRAVENOUS
  Filled 2017-09-24: qty 250

## 2017-09-24 NOTE — Progress Notes (Signed)
Family Medicine Teaching Service Daily Progress Note Intern Pager: [phone]  Patient name: Deanna Gonzales Medical record number: [id number] Date of birth: [birth year] Age: 84 y.o. Gender: female  Primary Care Provider: Manfred Shirts, PA Consultants: Orthopedic Surgery  Code Status: Full   Pt Overview and Major Events to Date:  2/13: Admitted 2/14: PT recc'd SNF, Dr. Lynann Bologna discussed rehab vs surgery next week w family 2/18: Re-eval by orthopedic surgery, plan surgery 2/20  Assessment and Plan: Celita Mashburnis a 84 y.o.femalepresenting with back pain following a recently diagnosed L4 compresion fracture.PMH is significant for A Fib (on warfarin), osteoporosis, hypothyroidism, HTN, and CKD.  Acute Back Pain 2/2 L4 Compression Fracture  Osteoporosis: Patient reports pain but states pain medications help alleviate pain.  -Ortho consulted; appreciate recs. Plan for kyphoplasty 2/20. Plan for pt to be NPO at midnight and hold anticoag   -Continue scheduled Oxycodone 5mg  Q6H, w 5mg  Q3H prn  -Continue Tylenol 650mg  Q6H scheduled -Continue baclofen 5mg  TID -Contiue calcitonin nasal spray once daily x 2wks (2/14- ) -Zofran prn nausea -TLSO brace  -Continue Ca 500mg  and Vit D 800 IU supplementation for osteoporosis treatment -Plan to startbisphosphonate 6 wks post-fracture if renal function permits -appreciate PT/OT recommendations - both recommending SNF   ParoxysmalAtrial Fibrillation Stable.Remains in NSR.INR 1.78 this morning -Continue home amiodarone 100mg  daily, diltiazem 24hr capsule 120mg  daily -hold warfarin pending surgery, heparin per pharmacy and will plan to hold at 0400 on 09/25/17 for surgery  -Daily PT-INR  Hypertension Chronic. Normotensive today, with BP of 137/67 -Continue home Triamterene-HCTZ 37.5-25mg every other day   CKD III Cr 1.35 (baseline 1.5-1.7).  -avoid NSAIDS, nephrotoxic agents -Avoid bisphosphonates w/Cr Cl <30-35  Anxiety Chronic,  stable -Continue home alprazolam 0.25mg  BID prn, mirtazapine 15mg  qHS  Hypothyroidism Chronic, stable. Last TSH 3.5 03/2017.  -Continue levothyroxine 154mcg daily (home dose 88 mcg)  FEN/GI:Heart healthy diet Prophylaxis:heparin per pharmacy, will hold prior to surgery   Disposition: continued inpatient stay, surgery planned on 09/25/2016  Subjective:  Patient stating she is in pain this morning. Per [facility], patient had not had prn pain medications at time of exam. Patient is working with PT and doing well. No other complaints at this time. Transferring from bed to chair with assistance, no problems with transfers.   Objective: Temp:  [98.1 F (36.7 C)-98.8 F (37.1 C)] 98.7 F (37.1 C) (02/19 0532) Pulse Rate:  [68-104] 68 (02/19 0532) Resp:  [17-18] 17 (02/19 0532) BP: (96-147)/(60-67) 137/67 (02/19 0532) SpO2:  [92 %-94 %] 92 % (02/19 0532) Physical Exam: General: awake and alert, laying in bed  Cardiovascular: RRR, no MRG Respiratory: CTAB, no wheezes, rales, or rhonchi  Abdomen: soft, non tender, non distended, bowel sounds normal  Extremities: non tender, no edema, able to move all extremities equally   Laboratory: Recent Labs  Lab 09/19/17 0533 09/23/17 0300 09/24/17 0616  WBC 9.8 8.9 9.8  HGB 14.8 13.4 14.1  HCT 46.4* 41.8 42.9  PLT 232 231 217   Recent Labs  Lab 09/19/17 0533 09/23/17 0300 09/24/17 0616  NA 141 137 139  K 3.8 4.2 3.9  CL 105 102 103  CO2 24 26 26   BUN 22* 26* 20  CREATININE 1.31* 1.43* 1.35*  CALCIUM 9.0 8.8* 9.0  GLUCOSE 85 97 85    Imaging/Diagnostic Tests: Ct Lumbar Spine Wo Contrast  Result Date: 09/12/2017 CLINICAL DATA:  Right hip pain for 1 week. Groin pain. EXAM: CT LUMBAR SPINE WITHOUT CONTRAST TECHNIQUE: Multidetector CT imaging of the  lumbar spine was performed without intravenous contrast administration. Multiplanar CT image reconstructions were also generated. COMPARISON:  None. FINDINGS: Segmentation: 5 lumbar type  vertebrae. Alignment: No static listhesis.  Mild kyphosis centered at L1. Vertebrae: Generalized osteopenia. Chronic L1 vertebral body compression fracture with approximately 60% height loss. Acute L4 vertebral body compression fracture with approximately 40% central height loss. Remainder the vertebral body heights are maintained. Paraspinal and other soft tissues: No paraspinal abnormality. Abdominal aortic atherosclerosis. Bilateral parapelvic cysts. Other: Mild osteoarthritis of bilateral sacroiliac joints. Disc levels: Disc spaces are maintained. No foraminal or central canal stenosis. IMPRESSION: 1. Acute L4 vertebral body compression fracture with approximately 40% central height loss. 2. Chronic L1 vertebral body compression fracture with approximately 60% height loss. Electronically Signed   By: Kathreen Devoid   On: 09/12/2017 16:03     Caroline More, DO 09/24/2017, 8:59 AM PGY-1, [place] Intern pager: [phone], text pages welcome

## 2017-09-24 NOTE — Progress Notes (Signed)
Physical Therapy Treatment Patient Details Name: Deanna Gonzales MRN: [id number] DOB: 1927/09/14 Today's Date: 09/24/2017    History of Present Illness 84 y.o. female admitted 2/12 with low back pain that has been worsening over last 12 days. Found to have L4 compressive fracture, per Ortho MD will be awaiting progress over hospitalization before reassessing kyphoplasty vs non operative treatment.  PMH includes:  presence of permanent cardiac pacemaker, HTN, Osteoporosis, GERD, CKD stage 3.     PT Comments    Patient continues to demonstrate decreased activity tolerance due to pain. Pt overall min A for sit to stand transfers and ambulation. Kyphoplasty scheduled for tomorrow. Continue to progress as tolerated.    Follow Up Recommendations  SNF;Supervision/Assistance - 24 hour     Equipment Recommendations  (TBD)    Recommendations for Other Services       Precautions / Restrictions Precautions Precautions: Fall Required Braces or Orthoses: (TLSO) Restrictions Weight Bearing Restrictions: No    Mobility  Bed Mobility               General bed mobility comments: pt OOB in chair upon arrival  Transfers Overall transfer level: Needs assistance Equipment used: Rolling walker (2 wheeled) Transfers: Sit to/from Stand Sit to Stand: Min assist         General transfer comment: assist to stand and cues for safe hand placement   Ambulation/Gait Ambulation/Gait assistance: Min guard Ambulation Distance (Feet): 100 Feet Assistive device: Rolling walker (2 wheeled) Gait Pattern/deviations: Step-through pattern;Decreased stride length Gait velocity: decreased   General Gait Details: cues for posture and increased stride length   Stairs            Wheelchair Mobility    Modified Rankin (Stroke Patients Only)       Balance Overall balance assessment: Needs assistance Sitting-balance support: No upper extremity supported;Feet supported Sitting balance-Leahy  Scale: Fair     Standing balance support: No upper extremity supported;During functional activity Standing balance-Leahy Scale: Fair                              Cognition Arousal/Alertness: Awake/alert Behavior During Therapy: WFL for tasks assessed/performed Overall Cognitive Status: Within Functional Limits for tasks assessed                                        Exercises General Exercises - Lower Extremity Ankle Circles/Pumps: AROM;Both;20 reps Long Arc Quad: AROM;Both;15 reps;Seated Hip ABduction/ADduction: AROM;Both;10 reps;Seated    General Comments        Pertinent Vitals/Pain Pain Assessment: Faces Faces Pain Scale: Hurts little more Pain Location: back and R thigh Pain Descriptors / Indicators: Guarding;Sore Pain Intervention(s): Limited activity within patient's tolerance;Monitored during session;Premedicated before session;Repositioned    Home Living                      Prior Function            PT Goals (current goals can now be found in the care plan section) Acute Rehab PT Goals Patient Stated Goal: return home PT Goal Formulation: With patient/family Time For Goal Achievement: 09/26/17 Potential to Achieve Goals: Fair Progress towards PT goals: Progressing toward goals    Frequency    Min 5X/week      PT Plan Current plan remains appropriate    Co-evaluation  AM-PAC PT "6 Clicks" Daily Activity  Outcome Measure  Difficulty turning over in bed (including adjusting bedclothes, sheets and blankets)?: Unable Difficulty moving from lying on back to sitting on the side of the bed? : Unable Difficulty sitting down on and standing up from a chair with arms (e.g., wheelchair, bedside commode, etc,.)?: Unable Help needed moving to and from a bed to chair (including a wheelchair)?: A Little Help needed walking in hospital room?: A Little Help needed climbing 3-5 steps with a railing? : A  Little 6 Click Score: 12    End of Session Equipment Utilized During Treatment: Gait belt;Back brace Activity Tolerance: Patient tolerated treatment well Patient left: with call bell/phone within reach;in chair Nurse Communication: Mobility status PT Visit Diagnosis: Unsteadiness on feet (R26.81);Other abnormalities of gait and mobility (R26.89);Muscle weakness (generalized) (M62.81);Pain Pain - Right/Left: (Lumbar) Pain - part of body: (Back)     Time: 1538-1600 PT Time Calculation (min) (ACUTE ONLY): 22 min  Charges:  $Gait Training: 8-22 mins                    G Codes:       Earney Navy, PTA Pager: [phone]     Darliss Cheney 09/24/2017, 4:19 PM

## 2017-09-24 NOTE — Progress Notes (Signed)
Family Medicine Teaching Service Daily Progress Note Intern Pager: [phone]  Patient name: Deanna Gonzales Medical record number: [id number] Date of birth: 1928/07/23 Age: 84 y.o. Gender: female  Primary Care Provider: Manfred Shirts, PA Consultants: Orthopedic surgery  Code Status: Full   Pt Overview and Major Events to Date:  2/13: Admitted 2/14: PT recc'd SNF, Dr. Lynann Bologna discussed rehab vs surgery next week w family 2/18: Re-eval by orthopedic surgery, plan surgery 2/20 2/20: Kyphoplasty   Assessment and Plan: Blessyn Mashburnis a 84 y.o.femalepresenting with back pain following a recently diagnosed L4 compresion fracture.PMH is significant for A Fib (on warfarin), osteoporosis, hypothyroidism, HTN, and CKD.  Acute Back Pain 2/2 L4 Compression Fracture  Osteoporosis: Patient reports pain during movement but states pain medications help alleviate pain.  -Ortho consulted; appreciate recs. Plan for kyphoplasty2/20. Plan for pt to beNPO atmidnightand hold anticoag  -Continuescheduled Oxycodone 5mg Q6H,w5mg Q3Hprn  -Continue Tylenol 650mg Q6Hscheduled -Continue baclofen 5mg  TID -Contiue calcitonin nasal spray once daily x 2wks (2/14- ) -Zofran prn nausea -TLSO brace  -Continue Ca 500mg  and Vit D 800 IU supplementation for osteoporosis treatment -Plan to startbisphosphonate 6 wks post-fractureif renal function permits -appreciate PT/OT recommendations - both recommending SNF   ParoxysmalAtrial Fibrillation Stable.Remains in NSR.INR 1.66this morning -Continue home amiodarone 100mg  daily, diltiazem 24hr capsule 120mg  daily -hold warfarin pending surgery, heparin per pharmacy and will plan to hold at 0400 on 09/25/17 for surgery  -Daily PT-INR  Hypertension Chronic. Normotensive today, with BP of 116/60 -Continue home Triamterene-HCTZ 37.5-25mg every other day  CKD III Cr1.53 (baseline 1.5-1.7).  -avoid NSAIDS, nephrotoxic agents -Avoid bisphosphonates  w/Cr Cl <30-35  Anxiety Chronic, stable -Continue home alprazolam 0.25mg  BID prn, mirtazapine 15mg  qHS  Hypothyroidism Chronic, stable. Last TSH 3.5 03/2017.  -Continue levothyroxine124mcg daily(home dose 88 mcg)  Constipation Patient reports difficulty having BM since admission -miralax daily   FEN/GI:Heart healthy diet Prophylaxis:heparin per pharmacy, will hold prior to surgery   Disposition: kyphoplasty planned today   Subjective:  Patient today with no complaints. No questions prior to procedure. States pain during movement.   Objective: Temp:  [98.1 F (36.7 C)-98.4 F (36.9 C)] 98.2 F (36.8 C) (02/20 0504) Pulse Rate:  [69-70] 69 (02/20 0504) Resp:  [16-20] 20 (02/20 0504) BP: (107-141)/(53-65) 116/60 (02/20 0504) SpO2:  [93 %-95 %] 93 % (02/20 0504) Weight:  [155 lb (70.3 kg)] 155 lb (70.3 kg) (02/20 0937) Physical Exam: General: awake and alert, laying in bed, NAD Cardiovascular: RRR, no MRG Respiratory: CTAB, no wheezes, rales, or rhonchi  Abdomen: soft, non tender, non distended, bowel sounds normal  Extremities: no edema, non tender   Laboratory: Recent Labs  Lab 09/23/17 0300 09/24/17 0616 09/25/17 0431  WBC 8.9 9.8 7.5  HGB 13.4 14.1 13.0  HCT 41.8 42.9 41.6  PLT 231 217 229   Recent Labs  Lab 09/23/17 0300 09/24/17 0616 09/25/17 0431  NA 137 139 139  K 4.2 3.9 3.8  CL 102 103 104  CO2 26 26 26   BUN 26* 20 22*  CREATININE 1.43* 1.35* 1.53*  CALCIUM 8.8* 9.0 8.7*  GLUCOSE 97 85 89    Imaging/Diagnostic Tests: Ct Lumbar Spine Wo Contrast  Result Date: 09/12/2017 CLINICAL DATA:  Right hip pain for 1 week. Groin pain. EXAM: CT LUMBAR SPINE WITHOUT CONTRAST TECHNIQUE: Multidetector CT imaging of the lumbar spine was performed without intravenous contrast administration. Multiplanar CT image reconstructions were also generated. COMPARISON:  None. FINDINGS: Segmentation: 5 lumbar type vertebrae. Alignment: No static listhesis.  Mild  kyphosis centered at L1. Vertebrae: Generalized osteopenia. Chronic L1 vertebral body compression fracture with approximately 60% height loss. Acute L4 vertebral body compression fracture with approximately 40% central height loss. Remainder the vertebral body heights are maintained. Paraspinal and other soft tissues: No paraspinal abnormality. Abdominal aortic atherosclerosis. Bilateral parapelvic cysts. Other: Mild osteoarthritis of bilateral sacroiliac joints. Disc levels: Disc spaces are maintained. No foraminal or central canal stenosis. IMPRESSION: 1. Acute L4 vertebral body compression fracture with approximately 40% central height loss. 2. Chronic L1 vertebral body compression fracture with approximately 60% height loss. Electronically Signed   By: Kathreen Devoid   On: 09/12/2017 16:03     Caroline More, DO 09/25/2017, 11:33 AM PGY-1, [facility] Intern pager: [phone], text pages welcome

## 2017-09-24 NOTE — Progress Notes (Addendum)
ANTICOAGULATION CONSULT NOTE - Follow Up Consult  Pharmacy Consult:  IV heparin Indication: atrial fibrillation  Allergies  Allergen Reactions  . Flexeril [Cyclobenzaprine] Anaphylaxis  . Vortioxetine Anaphylaxis  . Duloxetine Other (See Comments)    hypertension hypertension   . Lisinopril Other (See Comments)    unknown unknown   . Risedronate Other (See Comments)    arthralgia  . Risedronate Sodium Other (See Comments)    arthralgia arthralgia  . Adhesive [Tape] Rash    Gets red and welts  . Alendronate Nausea Only    Patient Measurements: Height: 5\' 8"  (172.7 cm) Weight: 155 lb (70.3 kg) IBW/kg (Calculated) : 63.9  Heparin dosing weight = 70 kg  Vital Signs: Temp: 98.7 F (37.1 C) (02/19 0532) Temp Source: Oral (02/19 0532) BP: 137/67 (02/19 0532) Pulse Rate: 68 (02/19 0532)  Labs: Recent Labs    09/22/17 0334 09/23/17 0300 09/24/17 0616  HGB  --  13.4 14.1  HCT  --  41.8 42.9  PLT  --  231 217  LABPROT 25.2* 25.5* 20.6*  INR 2.31 2.35 1.78  CREATININE  --  1.43* 1.35*    Estimated Creatinine Clearance: 29.6 mL/min (A) (by C-G formula based on SCr of 1.35 mg/dL (H)).   Assessment: 49 YOF on chronic warfarin for atrial fibrillation.  INR now sub-therapeutic and Pharmacy to transition patient to IV heparin bridge for kyphoplasty.  No bleeding reported.   Goal of Therapy:  Heparin level: 0.3-0.7 units/mL Monitor platelets by anticoagulation protocol: Yes     Plan:  Initiate heparin gtt at 1000 units/hr, hold on 09/25/17 at 0400 for surgery Check 8 hr heparin level Daily CBC, INR   Deanna Gonzales, PharmD, BCPS Pager:  [phone] 09/24/2017, 7:48 AM

## 2017-09-24 NOTE — Progress Notes (Signed)
ANTICOAGULATION CONSULT NOTE - Follow Up Consult  Pharmacy Consult:  IV heparin Indication: atrial fibrillation  Allergies  Allergen Reactions  . Flexeril [Cyclobenzaprine] Anaphylaxis  . Vortioxetine Anaphylaxis  . Duloxetine Other (See Comments)    hypertension hypertension   . Lisinopril Other (See Comments)    unknown unknown   . Risedronate Other (See Comments)    arthralgia  . Risedronate Sodium Other (See Comments)    arthralgia arthralgia  . Adhesive [Tape] Rash    Gets red and welts  . Alendronate Nausea Only    Patient Measurements: Height: 5\' 8"  (172.7 cm) Weight: 155 lb (70.3 kg) IBW/kg (Calculated) : 63.9  Heparin dosing weight = 70 kg  Vital Signs: Temp: 98.4 F (36.9 C) (02/19 1300) Temp Source: Oral (02/19 1300) BP: 141/65 (02/19 1300) Pulse Rate: 70 (02/19 1300)  Labs: Recent Labs    09/22/17 0334 09/23/17 0300 09/24/17 0616 09/24/17 1649  HGB  --  13.4 14.1  --   HCT  --  41.8 42.9  --   PLT  --  231 217  --   LABPROT 25.2* 25.5* 20.6*  --   INR 2.31 2.35 1.78  --   HEPARINUNFRC  --   --   --  0.28*  CREATININE  --  1.43* 1.35*  --     Estimated Creatinine Clearance: 29.6 mL/min (A) (by C-G formula based on SCr of 1.35 mg/dL (H)).   Assessment: 84 YOF on chronic warfarin for atrial fibrillation.  INR now sub-therapeutic and Pharmacy to transition patient to IV heparin bridge for kyphoplasty.  No bleeding reported.  Heparin drip 1000 uts/hr HL 0.28 just slightly less than goal but will probably accumulate some overnight as pt 84yo and Cr 1.4.  Plan for heparin to be turned off at 0400 so will not make changes tonight and f/u tomorrow post OR.    Goal of Therapy:  Heparin level: 0.3-0.7 units/mL Monitor platelets by anticoagulation protocol: Yes     Plan:  Continue heparin gtt at 1000 units/hr, hold on 09/25/17 at 0400 for surgery Follow Up after OR 2/20   Bonnita Nasuti Pharm.D. CPP, BCPS Clinical  Pharmacist [phone] 09/24/2017 6:20 PM

## 2017-09-24 NOTE — Social Work (Addendum)
CSW spoke with pt son, they have toured several facilities and would like to tour one more before making decision. Pt family aware that CSW would need to know SNF option so that they could initiate insurance authorization.   4:47pm- CSW received message from pt son Sharman Crate, the pt family would like [facility], [facility] accepted and is initiating insurance authorization.   CSW continuing to follow.   Alexander Mt, [place] Work [phone]

## 2017-09-25 ENCOUNTER — Inpatient Hospital Stay (HOSPITAL_COMMUNITY): Payer: Medicare Other | Admitting: Certified Registered"

## 2017-09-25 ENCOUNTER — Encounter (HOSPITAL_COMMUNITY): Admission: EM | Disposition: A | Discharge status: Skilled Nursing Facility | Payer: Self-pay | Source: Home / Self Care

## 2017-09-25 ENCOUNTER — Encounter (HOSPITAL_COMMUNITY): Payer: Self-pay | Admitting: *Deleted

## 2017-09-25 ENCOUNTER — Inpatient Hospital Stay (HOSPITAL_COMMUNITY): Payer: Medicare Other

## 2017-09-25 HISTORY — PX: KYPHOPLASTY: SHX5884

## 2017-09-25 LAB — BASIC METABOLIC PANEL
Anion gap: 9 (ref 5–15)
BUN: 22 mg/dL — AB (ref 6–20)
CHLORIDE: 104 mmol/L (ref 101–111)
CO2: 26 mmol/L (ref 22–32)
CREATININE: 1.53 mg/dL — AB (ref 0.44–1.00)
Calcium: 8.7 mg/dL — ABNORMAL LOW (ref 8.9–10.3)
GFR calc Af Amer: 34 mL/min — ABNORMAL LOW (ref >60)
GFR calc non Af Amer: 29 mL/min — ABNORMAL LOW (ref >60)
Glucose, Bld: 89 mg/dL (ref 65–99)
Potassium: 3.8 mmol/L (ref 3.5–5.1)
Sodium: 139 mmol/L (ref 135–145)

## 2017-09-25 LAB — PROTIME-INR
INR: 1.66
PROTHROMBIN TIME: 19.4 s — AB (ref 11.4–15.2)

## 2017-09-25 LAB — CBC
HEMATOCRIT: 41.6 % (ref 36.0–46.0)
HEMOGLOBIN: 13 g/dL (ref 12.0–15.0)
MCH: 31.3 pg (ref 26.0–34.0)
MCHC: 31.3 g/dL (ref 30.0–36.0)
MCV: 100 fL (ref 78.0–100.0)
Platelets: 229 10*3/uL (ref 150–400)
RBC: 4.16 MIL/uL (ref 3.87–5.11)
RDW: 14.1 % (ref 11.5–15.5)
WBC: 7.5 10*3/uL (ref 4.0–10.5)

## 2017-09-25 SURGERY — KYPHOPLASTY
Anesthesia: General

## 2017-09-25 MED ORDER — WARFARIN - PHARMACIST DOSING INPATIENT: Freq: Every day | Status: DC

## 2017-09-25 MED ORDER — ONDANSETRON HCL 4 MG/2ML IJ SOLN
INTRAMUSCULAR | Status: DC | PRN
  Administered 2017-09-25: 4 mg via INTRAVENOUS

## 2017-09-25 MED ORDER — CEFAZOLIN SODIUM-DEXTROSE 2-4 GM/100ML-% IV SOLN
2.0000 g | Freq: Once | INTRAVENOUS | Status: AC
  Administered 2017-09-25: 2 g via INTRAVENOUS

## 2017-09-25 MED ORDER — 0.9 % SODIUM CHLORIDE (POUR BTL) OPTIME
TOPICAL | Status: DC | PRN
  Administered 2017-09-25: 1000 mL

## 2017-09-25 MED ORDER — FENTANYL CITRATE (PF) 100 MCG/2ML IJ SOLN
INTRAMUSCULAR | Status: DC | PRN
  Administered 2017-09-25: 100 ug via INTRAVENOUS

## 2017-09-25 MED ORDER — ONDANSETRON HCL 4 MG/2ML IJ SOLN: 4.0000 mg | Freq: Once | INTRAMUSCULAR | Status: DC | PRN

## 2017-09-25 MED ORDER — BACITRACIN ZINC 500 UNIT/GM EX OINT
TOPICAL_OINTMENT | CUTANEOUS | Status: AC
  Filled 2017-09-25: qty 28.35

## 2017-09-25 MED ORDER — HYDROMORPHONE HCL 1 MG/ML IJ SOLN
0.2500 mg | INTRAMUSCULAR | Status: DC | PRN
  Administered 2017-09-25: 0.5 mg via INTRAVENOUS
  Administered 2017-09-25 (×2): 0.25 mg via INTRAVENOUS

## 2017-09-25 MED ORDER — CEFAZOLIN SODIUM-DEXTROSE 2-4 GM/100ML-% IV SOLN
2.0000 g | Freq: Three times a day (TID) | INTRAVENOUS | Status: AC
  Administered 2017-09-25: 2 g via INTRAVENOUS
  Filled 2017-09-25 (×2): qty 100

## 2017-09-25 MED ORDER — LIDOCAINE 2% (20 MG/ML) 5 ML SYRINGE
INTRAMUSCULAR | Status: AC
  Filled 2017-09-25: qty 5

## 2017-09-25 MED ORDER — ROCURONIUM BROMIDE 10 MG/ML (PF) SYRINGE
PREFILLED_SYRINGE | INTRAVENOUS | Status: AC
  Filled 2017-09-25: qty 5

## 2017-09-25 MED ORDER — LACTATED RINGERS IV SOLN
INTRAVENOUS | Status: DC
  Administered 2017-09-25 (×2): via INTRAVENOUS

## 2017-09-25 MED ORDER — PROPOFOL 10 MG/ML IV BOLUS
INTRAVENOUS | Status: DC | PRN
  Administered 2017-09-25: 90 mg via INTRAVENOUS

## 2017-09-25 MED ORDER — IOPAMIDOL (ISOVUE-300) INJECTION 61%
INTRAVENOUS | Status: AC
  Filled 2017-09-25: qty 50

## 2017-09-25 MED ORDER — GLYCOPYRROLATE 0.2 MG/ML IV SOSY
PREFILLED_SYRINGE | INTRAVENOUS | Status: DC | PRN
  Administered 2017-09-25: 0.6 mg via INTRAVENOUS

## 2017-09-25 MED ORDER — NEOSTIGMINE METHYLSULFATE 10 MG/10ML IV SOLN
INTRAVENOUS | Status: DC | PRN
  Administered 2017-09-25: 3.5 mg via INTRAVENOUS

## 2017-09-25 MED ORDER — FENTANYL CITRATE (PF) 250 MCG/5ML IJ SOLN
INTRAMUSCULAR | Status: AC
  Filled 2017-09-25: qty 5

## 2017-09-25 MED ORDER — POLYETHYLENE GLYCOL 3350 17 G PO PACK
17.0000 g | PACK | Freq: Every day | ORAL | Status: DC
  Administered 2017-09-26: 17 g via ORAL
  Filled 2017-09-25: qty 1

## 2017-09-25 MED ORDER — BACITRACIN 500 UNIT/GM EX OINT
TOPICAL_OINTMENT | CUTANEOUS | Status: DC | PRN
  Administered 2017-09-25: 1 via TOPICAL

## 2017-09-25 MED ORDER — LIDOCAINE 2% (20 MG/ML) 5 ML SYRINGE
INTRAMUSCULAR | Status: DC | PRN
  Administered 2017-09-25: 100 mg via INTRAVENOUS

## 2017-09-25 MED ORDER — MEPERIDINE HCL 50 MG/ML IJ SOLN: 6.2500 mg | INTRAMUSCULAR | Status: DC | PRN

## 2017-09-25 MED ORDER — ROCURONIUM BROMIDE 10 MG/ML (PF) SYRINGE
PREFILLED_SYRINGE | INTRAVENOUS | Status: DC | PRN
  Administered 2017-09-25: 50 mg via INTRAVENOUS

## 2017-09-25 MED ORDER — IOPAMIDOL (ISOVUE-300) INJECTION 61%
INTRAVENOUS | Status: DC | PRN
  Administered 2017-09-25: 50 mL via INTRAVENOUS

## 2017-09-25 MED ORDER — ONDANSETRON HCL 4 MG/2ML IJ SOLN
INTRAMUSCULAR | Status: AC
  Filled 2017-09-25: qty 2

## 2017-09-25 MED ORDER — PROPOFOL 10 MG/ML IV BOLUS
INTRAVENOUS | Status: AC
  Filled 2017-09-25: qty 20

## 2017-09-25 MED ORDER — HYDROMORPHONE HCL 1 MG/ML IJ SOLN
INTRAMUSCULAR | Status: AC
  Administered 2017-09-25: 0.5 mg via INTRAVENOUS
  Filled 2017-09-25: qty 1

## 2017-09-25 MED ORDER — WARFARIN SODIUM 5 MG PO TABS
5.0000 mg | ORAL_TABLET | Freq: Once | ORAL | Status: AC
  Administered 2017-09-25: 5 mg via ORAL
  Filled 2017-09-25: qty 1

## 2017-09-25 MED ORDER — CEFAZOLIN SODIUM-DEXTROSE 2-4 GM/100ML-% IV SOLN
INTRAVENOUS | Status: AC
  Filled 2017-09-25: qty 100

## 2017-09-25 MED ORDER — PHENYLEPHRINE 40 MCG/ML (10ML) SYRINGE FOR IV PUSH (FOR BLOOD PRESSURE SUPPORT)
PREFILLED_SYRINGE | INTRAVENOUS | Status: DC | PRN
  Administered 2017-09-25 (×2): 80 ug via INTRAVENOUS

## 2017-09-25 SURGICAL SUPPLY — 41 items
BLADE SURG 15 STRL LF DISP TIS (BLADE) ×1 IMPLANT
BLADE SURG 15 STRL SS (BLADE) ×2
CEMENT BONE KYPHX HV R (Orthopedic Implant) ×3 IMPLANT
COVER MAYO STAND STRL (DRAPES) ×3 IMPLANT
COVER SURGICAL LIGHT HANDLE (MISCELLANEOUS) ×3 IMPLANT
CURETTE EXPRESS SZ2 7MM (INSTRUMENTS) IMPLANT
CURRETTE EXPRESS SZ2 7MM (INSTRUMENTS)
DRAPE C-ARM 42X72 X-RAY (DRAPES) ×6 IMPLANT
DRAPE HALF SHEET 40X57 (DRAPES) ×3 IMPLANT
DRAPE INCISE IOBAN 66X45 STRL (DRAPES) ×3 IMPLANT
DRAPE LAPAROTOMY T 102X78X121 (DRAPES) ×3 IMPLANT
DRAPE SURG 17X23 STRL (DRAPES) ×12 IMPLANT
DRSG MEPILEX BORDER 4X4 (GAUZE/BANDAGES/DRESSINGS) ×3 IMPLANT
DURAPREP 26ML APPLICATOR (WOUND CARE) ×3 IMPLANT
GAUZE SPONGE 4X4 16PLY XRAY LF (GAUZE/BANDAGES/DRESSINGS) ×3 IMPLANT
GLOVE BIO SURGEON STRL SZ7 (GLOVE) ×3 IMPLANT
GLOVE BIO SURGEON STRL SZ8 (GLOVE) ×3 IMPLANT
GLOVE BIOGEL PI IND STRL 7.0 (GLOVE) ×1 IMPLANT
GLOVE BIOGEL PI IND STRL 8 (GLOVE) ×1 IMPLANT
GLOVE BIOGEL PI INDICATOR 7.0 (GLOVE) ×2
GLOVE BIOGEL PI INDICATOR 8 (GLOVE) ×2
GOWN STRL REUS W/ TWL LRG LVL3 (GOWN DISPOSABLE) ×2 IMPLANT
GOWN STRL REUS W/ TWL XL LVL3 (GOWN DISPOSABLE) ×1 IMPLANT
GOWN STRL REUS W/TWL LRG LVL3 (GOWN DISPOSABLE) ×4
GOWN STRL REUS W/TWL XL LVL3 (GOWN DISPOSABLE) ×2
KIT BASIN OR (CUSTOM PROCEDURE TRAY) ×3 IMPLANT
KIT ROOM TURNOVER OR (KITS) ×3 IMPLANT
NEEDLE 22X1 1/2 (OR ONLY) (NEEDLE) IMPLANT
NEEDLE HYPO 25X1 1.5 SAFETY (NEEDLE) ×3 IMPLANT
NEEDLE SPNL 18GX3.5 QUINCKE PK (NEEDLE) ×6 IMPLANT
NS IRRIG 1000ML POUR BTL (IV SOLUTION) ×3 IMPLANT
PACK UNIVERSAL I (CUSTOM PROCEDURE TRAY) ×3 IMPLANT
PAD ARMBOARD 7.5X6 YLW CONV (MISCELLANEOUS) ×6 IMPLANT
POSITIONER HEAD PRONE TRACH (MISCELLANEOUS) ×3 IMPLANT
SUT MNCRL AB 4-0 PS2 18 (SUTURE) ×3 IMPLANT
SYR BULB IRRIGATION 50ML (SYRINGE) ×3 IMPLANT
SYR CONTROL 10ML LL (SYRINGE) ×3 IMPLANT
TOWEL OR 17X24 6PK STRL BLUE (TOWEL DISPOSABLE) IMPLANT
TOWEL OR 17X26 10 PK STRL BLUE (TOWEL DISPOSABLE) ×3 IMPLANT
TRAY KYPHOPAK 15/3 ONESTEP 1ST (MISCELLANEOUS) IMPLANT
TRAY KYPHOPAK 20/3 ONESTEP 1ST (MISCELLANEOUS) ×3 IMPLANT

## 2017-09-25 NOTE — Anesthesia Procedure Notes (Signed)
Procedure Name: Intubation Date/Time: 09/25/2017 10:42 AM Performed by: Lillia Abed, MD Pre-anesthesia Checklist: Patient identified, Emergency Drugs available, Suction available and Patient being monitored Patient Re-evaluated:Patient Re-evaluated prior to induction Oxygen Delivery Method: Circle System Utilized Preoxygenation: Pre-oxygenation with 100% oxygen Induction Type: IV induction Ventilation: Mask ventilation without difficulty Laryngoscope Size: Mac and 3 Grade View: Grade I Tube type: Oral Tube size: 7.0 mm Number of attempts: 1 Airway Equipment and Method: Stylet and Oral airway Placement Confirmation: ETT inserted through vocal cords under direct vision,  positive ETCO2 and breath sounds checked- equal and bilateral Secured at: 20 cm Tube secured with: Tape Dental Injury: Teeth and Oropharynx as per pre-operative assessment  Comments: Intubation performed my Theodis Shove, EMT student under direct supervision of MDA and CRNA

## 2017-09-25 NOTE — Transfer of Care (Signed)
Immediate Anesthesia Transfer of Care Note  Patient: Deanna Gonzales  Procedure(s) Performed: KYPHOPLASTY LUMBAR FOUR (N/A )  Patient Location: PACU  Anesthesia Type:General  Level of Consciousness: awake and oriented  Airway & Oxygen Therapy: Patient Spontanous Breathing and Patient connected to nasal cannula oxygen  Post-op Assessment: Report given to RN, Patient moving all extremities and Patient moving all extremities X 4  Post vital signs: Reviewed and stable  Last Vitals:  Vitals:   09/25/17 0504 09/25/17 1159  BP: 116/60   Pulse: 69 76  Resp: 20 11  Temp: 36.8 C 36.9 C  SpO2: 93% 96%    Last Pain:  Vitals:   09/25/17 0729  TempSrc:   PainSc: 8       Patients Stated Pain Goal: 0 (34/28/76 8115)  Complications: No apparent anesthesia complications

## 2017-09-25 NOTE — H&P (Signed)
PREOPERATIVE H&P  Chief Complaint: Low back pain  HPI: Deanna Gonzales is a 84 y.o. female who presents with ongoing pain in the low back x 3 weeks  CT and xrays reveal an L4 compression fracture.  Patient has failed multiple forms of conservative care and continues to have pain (see office notes for additional details regarding the patient's full course of treatment)  Past Medical History:  Diagnosis Date  . Age-related macular degeneration   . Anxiety   . Atrial fibrillation ([place])   . Basal cell carcinoma    "nose"  . Chronic lower back pain   . CKD (chronic kidney disease), stage III ([place])    [name] 09/18/2017  . Depression   . Family history of adverse reaction to anesthesia    "sister passed away when she was 43; from anesthesia" (09/18/2017)  . GERD (gastroesophageal reflux disease)   . History of blood transfusion    "related to OR"  . Hypertension   . Hypothyroidism   . Lumbar compression fracture ([place]) 09/06/2017   L4; Atraumatic/notes 09/18/2017  . Osteoarthritis    "all over" (09/18/2017)  . Osteoporosis   . Osteoporosis   . Presence of permanent cardiac pacemaker ?2012; ?2016  . Thyroid disease    Past Surgical History:  Procedure Laterality Date  . ABDOMINAL EXPLORATION SURGERY  ?1960   "benign tumor on intestines"  . ABDOMINAL HYSTERECTOMY  1980   "partial"  . APPENDECTOMY    . ATRIAL ABLATION SURGERY    . BASAL CELL CARCINOMA EXCISION     nose  . CARDIAC CATHETERIZATION    . CATARACT EXTRACTION W/ INTRAOCULAR LENS  IMPLANT, BILATERAL Bilateral   . INSERT / REPLACE / REMOVE PACEMAKER  ?2012; ?2016  . PACEMAKER REMOVAL  ?2012   "1st one got infected; didn't have a pacemaker for 2-3 years"   Social History   Socioeconomic History  . Marital status: Widowed    Spouse name: None  . Number of children: None  . Years of education: None  . Highest education level: None  Social Needs  . Financial resource strain: None  . Food insecurity -  worry: None  . Food insecurity - inability: None  . Transportation needs - medical: None  . Transportation needs - non-medical: None  Occupational History  . None  Tobacco Use  . Smoking status: Never Smoker  . Smokeless tobacco: Never Used  Substance and Sexual Activity  . Alcohol use: No  . Drug use: No  . Sexual activity: None  Other Topics Concern  . None  Social History Narrative  . None   Family History  Problem Relation Age of Onset  . Anxiety disorder Mother   . Heart disease Mother   . Hypertension Father   . Heart disease Father   . Anxiety disorder Sister   . Aneurysm Sister    Allergies  Allergen Reactions  . Flexeril [Cyclobenzaprine] Anaphylaxis  . Vortioxetine Anaphylaxis  . Duloxetine Other (See Comments)    hypertension hypertension   . Lisinopril Other (See Comments)    unknown unknown   . Risedronate Other (See Comments)    arthralgia  . Risedronate Sodium Other (See Comments)    arthralgia arthralgia  . Adhesive [Tape] Rash    Gets red and welts  . Alendronate Nausea Only   Prior to Admission medications   Medication Sig Start Date End Date Taking? Authorizing Provider  ALPRAZolam Duanne Moron) 0.25 MG tablet Take 0.25 mg by mouth at  bedtime.  08/21/17  Yes [provider]  amiodarone (PACERONE) 100 MG tablet Take 100 mg by mouth daily.    Yes [provider]  diltiazem (CARDIZEM) 120 MG tablet Take 120 mg by mouth daily. 06/26/17  Yes [provider]  gabapentin (NEURONTIN) 100 MG capsule Take 200 mg by mouth 2 (two) times daily.    Yes [provider]  hydrochlorothiazide (HYDRODIURIL) 25 MG tablet Take 25 mg by mouth daily. Unknown dose    Yes [provider]  levothyroxine (SYNTHROID, LEVOTHROID) 100 MCG tablet Take 100 mcg by mouth daily. 07/02/17  Yes [provider]  losartan (COZAAR) 25 MG tablet Take 25 mg by mouth 2 (two) times daily. 06/26/17  Yes [provider]    mirtazapine (REMERON) 15 MG tablet Take 15 mg by mouth at bedtime. 08/05/17  Yes [provider]  oxyCODONE-acetaminophen (PERCOCET/ROXICET) 5-325 MG tablet Take 1 tablet by mouth every 8 (eight) hours as needed for severe pain. 09/09/17  Yes [provider]  warfarin (COUMADIN) 3 MG tablet Take 1.5-3 mg by mouth See admin instructions. Take 1.5mg  on SAT and 3mg  on all other days 06/18/17  Yes [provider]     All other systems have been reviewed and were otherwise negative with the exception of those mentioned in the HPI and as above.  Physical Exam: Vitals:   09/24/17 2041 09/25/17 0504  BP: (!) 107/53 116/60  Pulse: 70 69  Resp: 16 20  Temp: 98.1 F (36.7 C) 98.2 F (36.8 C)  SpO2: 95% 93%    Body mass index is 23.57 kg/m.  General: Alert, no acute distress Cardiovascular: No pedal edema Respiratory: No cyanosis, no use of accessory musculature Skin: No lesions in the area of chief complaint Neurologic: Sensation intact distally Psychiatric: Patient is competent for consent with normal mood and affect Lymphatic: No axillary or cervical lymphadenopathy  MUSCULOSKELETAL: + TTP low back  Assessment/Plan: fracture L4 vertebrae Plan for Procedure(s): KYPHOPLASTY L4   Sinclair Ship, MD 09/25/2017 6:56 AM

## 2017-09-25 NOTE — Discharge Instructions (Addendum)
You were admitted for your back pain. You received a kyphoplasty by orthopedics on 09/26/17.   Information on my medicine - Coumadin   (Warfarin)  This medication education was reviewed with me or my healthcare representative as part of my discharge preparation.  The pharmacist that spoke with me during my hospital stay was:  Saundra Shelling, [facility]  Why was Coumadin prescribed for you? Coumadin was prescribed for you because you have a blood clot or a medical condition that can cause an increased risk of forming blood clots. Blood clots can cause serious health problems by blocking the flow of blood to the heart, lung, or brain. Coumadin can prevent harmful blood clots from forming. As a reminder your indication for Coumadin is:   Stroke Prevention Because Of Atrial Fibrillation  What test will check on my response to Coumadin? While on Coumadin (warfarin) you will need to have an INR test regularly to ensure that your dose is keeping you in the desired range. The INR (international normalized ratio) number is calculated from the result of the laboratory test called prothrombin time (PT).  If an INR APPOINTMENT HAS NOT ALREADY BEEN MADE FOR YOU please schedule an appointment to have this lab work done by your health care provider within 7 days. Your INR goal is usually a number between:  2 to 3 or your provider may give you a more narrow range like 2-2.5.  Ask your health care provider during an office visit what your goal INR is.  What  do you need to  know  About  COUMADIN? Take Coumadin (warfarin) exactly as prescribed by your healthcare provider about the same time each day.  DO NOT stop taking without talking to the doctor who prescribed the medication.  Stopping without other blood clot prevention medication to take the place of Coumadin may increase your risk of developing a new clot or stroke.  Get refills before you run out.  What do you do if you miss a dose? If you miss a dose, take it  as soon as you remember on the same day then continue your regularly scheduled regimen the next day.  Do not take two doses of Coumadin at the same time.  Important Safety Information A possible side effect of Coumadin (Warfarin) is an increased risk of bleeding. You should call your healthcare provider right away if you experience any of the following: ? Bleeding from an injury or your nose that does not stop. ? Unusual colored urine (red or dark brown) or unusual colored stools (red or black). ? Unusual bruising for unknown reasons. ? A serious fall or if you hit your head (even if there is no bleeding).  Some foods or medicines interact with Coumadin (warfarin) and might alter your response to warfarin. To help avoid this: ? Eat a balanced diet, maintaining a consistent amount of Vitamin K. ? Notify your provider about major diet changes you plan to make. ? Avoid alcohol or limit your intake to 1 drink for women and 2 drinks for men per day. (1 drink is 5 oz. wine, 12 oz. beer, or 1.5 oz. liquor.)  Make sure that ANY health care provider who prescribes medication for you knows that you are taking Coumadin (warfarin).  Also make sure the healthcare provider who is monitoring your Coumadin knows when you have started a new medication including herbals and non-prescription products.  Coumadin (Warfarin)  Major Drug Interactions  Increased Warfarin Effect Decreased Warfarin Effect  Alcohol (  large quantities) Antibiotics (esp. Septra/Bactrim, Flagyl, Cipro) Amiodarone (Cordarone) Aspirin (ASA) Cimetidine (Tagamet) Megestrol (Megace) NSAIDs (ibuprofen, naproxen, etc.) Piroxicam (Feldene) Propafenone (Rythmol SR) Propranolol (Inderal) Isoniazid (INH) Posaconazole (Noxafil) Barbiturates (Phenobarbital) Carbamazepine (Tegretol) Chlordiazepoxide (Librium) Cholestyramine (Questran) Griseofulvin Oral Contraceptives Rifampin Sucralfate (Carafate) Vitamin K   Coumadin (Warfarin) Major  Herbal Interactions  Increased Warfarin Effect Decreased Warfarin Effect  Garlic Ginseng Ginkgo biloba Coenzyme Q10 Green tea St. Johns wort    Coumadin (Warfarin) FOOD Interactions  Eat a consistent number of servings per week of foods HIGH in Vitamin K (1 serving =  cup)  Collards (cooked, or boiled & drained) Kale (cooked, or boiled & drained) Mustard greens (cooked, or boiled & drained) Parsley *serving size only =  cup Spinach (cooked, or boiled & drained) Swiss chard (cooked, or boiled & drained) Turnip greens (cooked, or boiled & drained)  Eat a consistent number of servings per week of foods MEDIUM-HIGH in Vitamin K (1 serving = 1 cup)  Asparagus (cooked, or boiled & drained) Broccoli (cooked, boiled & drained, or raw & chopped) Brussel sprouts (cooked, or boiled & drained) *serving size only =  cup Lettuce, raw (green leaf, endive, romaine) Spinach, raw Turnip greens, raw & chopped   These websites have more information on Coumadin (warfarin):  FailFactory.se; VeganReport.com.au;

## 2017-09-25 NOTE — Anesthesia Preprocedure Evaluation (Addendum)
Anesthesia Evaluation  Patient identified by MRN, date of birth, ID band Patient awake    Reviewed: Allergy & Precautions, NPO status , Patient's Chart, lab work & pertinent test results  Airway Mallampati: II  TM Distance: <3 FB Neck ROM: Full    Dental  (+) Edentulous Upper, Edentulous Lower, Dental Advisory Given   Pulmonary    Pulmonary exam normal        Cardiovascular hypertension, Normal cardiovascular exam+ dysrhythmias Atrial Fibrillation + pacemaker      Neuro/Psych Anxiety Depression    GI/Hepatic GERD  Medicated and Controlled,  Endo/Other    Renal/GU Renal InsufficiencyRenal disease     Musculoskeletal   Abdominal   Peds  Hematology   Anesthesia Other Findings   Reproductive/Obstetrics                            Anesthesia Physical Anesthesia Plan  ASA: III  Anesthesia Plan: General   Post-op Pain Management:    Induction: Intravenous  PONV Risk Score and Plan: 3 and Ondansetron and Treatment may vary due to age or medical condition  Airway Management Planned: Oral ETT  Additional Equipment:   Intra-op Plan:   Post-operative Plan: Extubation in OR  Informed Consent: I have reviewed the patients History and Physical, chart, labs and discussed the procedure including the risks, benefits and alternatives for the proposed anesthesia with the patient or authorized representative who has indicated his/her understanding and acceptance.     Plan Discussed with: CRNA and Surgeon  Anesthesia Plan Comments:         Anesthesia Quick Evaluation

## 2017-09-25 NOTE — Progress Notes (Signed)
ANTICOAGULATION CONSULT NOTE - Initial Consult  Pharmacy Consult:  Coumadin Indication: atrial fibrillation  Allergies  Allergen Reactions  . Flexeril [Cyclobenzaprine] Anaphylaxis  . Vortioxetine Anaphylaxis  . Duloxetine Other (See Comments)    hypertension hypertension   . Lisinopril Other (See Comments)    unknown unknown   . Risedronate Other (See Comments)    arthralgia  . Risedronate Sodium Other (See Comments)    arthralgia arthralgia  . Adhesive [Tape] Rash    Gets red and welts  . Alendronate Nausea Only    Patient Measurements: Height: 5\' 8"  (172.7 cm) Weight: 155 lb (70.3 kg) IBW/kg (Calculated) : 63.9  Vital Signs: Temp: 98.6 F (37 C) (02/20 1314) Temp Source: Oral (02/20 1314) BP: 166/77 (02/20 1314) Pulse Rate: 69 (02/20 1314)  Labs: Recent Labs    09/23/17 0300 09/24/17 0616 09/24/17 1649 09/25/17 0431  HGB 13.4 14.1  --  13.0  HCT 41.8 42.9  --  41.6  PLT 231 217  --  229  LABPROT 25.5* 20.6*  --  19.4*  INR 2.35 1.78  --  1.66  HEPARINUNFRC  --   --  0.28*  --   CREATININE 1.43* 1.35*  --  1.53*    Estimated Creatinine Clearance: 26.1 mL/min (A) (by C-G formula based on SCr of 1.53 mg/dL (H)).   Medical History: Past Medical History:  Diagnosis Date  . Age-related macular degeneration   . Anxiety   . Atrial fibrillation ([place])   . Basal cell carcinoma    "nose"  . Chronic lower back pain   . CKD (chronic kidney disease), stage III ([place])    [name] 09/18/2017  . Depression   . Family history of adverse reaction to anesthesia    "sister passed away when she was 59; from anesthesia" (09/18/2017)  . GERD (gastroesophageal reflux disease)   . History of blood transfusion    "related to OR"  . Hypertension   . Hypothyroidism   . Lumbar compression fracture ([place]) 09/06/2017   L4; Atraumatic/notes 09/18/2017  . Osteoarthritis    "all over" (09/18/2017)  . Osteoporosis   . Osteoporosis   . Presence of permanent cardiac pacemaker  ?2012; ?2016  . Thyroid disease       Assessment: 56 YOF with history of Afib on Coumadin PTA.  She was transitioned to IV heparin for kyphoplasty today 09/25/17.  Pharmacy consulted to resume Coumadin post-op.  INR sub-therapeutic as expected.  No bleeding reported.  Home Coumadin dose: 3mg  daily except 1.5mg  on Saturday   Goal of Therapy:  INR 2-3 Monitor platelets by anticoagulation protocol: Yes    Plan:  Coumadin 5mg  PO today Daily PT / INR D/C daily CBC   Srihith Aquilino D. Mina Marble, PharmD, BCPS Pager:  [phone] 09/25/2017, 2:21 PM

## 2017-09-25 NOTE — Progress Notes (Signed)
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  Dr. Conrad [place] notified and stated that [place] does not need to be prsent.

## 2017-09-25 NOTE — Progress Notes (Addendum)
0945 Pt to short stay for Kyphoplasty this morning. Family present.   1310 Received pt back from PACU, A&O x4, lumbar dressing dry and intact. Denies pain at this time.

## 2017-09-25 NOTE — Progress Notes (Signed)
    Patients kyphoplasty procedure went well and without complication. From an orthopaedic spine standpoint she is safe to D/C home. She has no restrictions and can progress activity as tolerated. She no longer requires the TLSO brace but can revise it to a LSO and use PRN for comfort. Keep incisions dry for 5 days then can remove band aids and shower normally. Pt should F/U in our office in 2 weeks. (10/09/2017 8:15am)   Pt will return to medicine service and D/C home per them when deemed appropriate.   Pricilla Holm, PA-C Orthopaedic Spine Surgery [facility]  [phone]

## 2017-09-25 NOTE — Anesthesia Postprocedure Evaluation (Signed)
Anesthesia Post Note  Patient: Engineer, manufacturing systems  Procedure(s) Performed: KYPHOPLASTY LUMBAR FOUR (N/A )     Patient location during evaluation: PACU Anesthesia Type: General Level of consciousness: awake and alert Pain management: pain level controlled Vital Signs Assessment: post-procedure vital signs reviewed and stable Respiratory status: spontaneous breathing, nonlabored ventilation, respiratory function stable and patient connected to nasal cannula oxygen Cardiovascular status: blood pressure returned to baseline and stable Postop Assessment: no apparent nausea or vomiting Anesthetic complications: no    Last Vitals:  Vitals:   09/25/17 1248 09/25/17 1314  BP:  (!) 166/77  Pulse:  69  Resp:  18  Temp: 36.7 C 37 C  SpO2:  99%    Last Pain:  Vitals:   09/25/17 1314  TempSrc: Oral  PainSc:                  Deno Sida DAVID

## 2017-09-26 ENCOUNTER — Encounter (HOSPITAL_COMMUNITY): Payer: Self-pay | Admitting: Orthopedic Surgery

## 2017-09-26 LAB — BASIC METABOLIC PANEL
ANION GAP: 9 (ref 5–15)
BUN: 15 mg/dL (ref 6–20)
CALCIUM: 8.7 mg/dL — AB (ref 8.9–10.3)
CO2: 25 mmol/L (ref 22–32)
Chloride: 106 mmol/L (ref 101–111)
Creatinine, Ser: 1.24 mg/dL — ABNORMAL HIGH (ref 0.44–1.00)
GFR, EST AFRICAN AMERICAN: 44 mL/min — AB (ref >60)
GFR, EST NON AFRICAN AMERICAN: 38 mL/min — AB (ref >60)
Glucose, Bld: 84 mg/dL (ref 65–99)
POTASSIUM: 4.3 mmol/L (ref 3.5–5.1)
Sodium: 140 mmol/L (ref 135–145)

## 2017-09-26 LAB — PROTIME-INR
INR: 1.58
PROTHROMBIN TIME: 18.7 s — AB (ref 11.4–15.2)

## 2017-09-26 MED ORDER — SENNOSIDES-DOCUSATE SODIUM 8.6-50 MG PO TABS: 1.0000 | ORAL_TABLET | Freq: Two times a day (BID) | ORAL | 0 refills | Status: DC | PRN

## 2017-09-26 MED ORDER — TRIAMTERENE-HCTZ 37.5-25 MG PO TABS: 1.0000 | ORAL_TABLET | ORAL | 0 refills | Status: AC

## 2017-09-26 MED ORDER — OXYCODONE HCL 5 MG PO TABS: 5.0000 mg | ORAL_TABLET | ORAL | 0 refills | Status: DC | PRN

## 2017-09-26 MED ORDER — CHOLECALCIFEROL 25 MCG (1000 UT) PO TABS: 1000.0000 [IU] | ORAL_TABLET | Freq: Every day | ORAL | 0 refills | Status: AC

## 2017-09-26 MED ORDER — BACLOFEN 5 MG PO TABS: 5.0000 mg | ORAL_TABLET | Freq: Three times a day (TID) | ORAL | 0 refills | Status: DC

## 2017-09-26 MED ORDER — POLYETHYLENE GLYCOL 3350 17 G PO PACK: 17.0000 g | PACK | Freq: Every day | ORAL | 0 refills | Status: DC

## 2017-09-26 MED ORDER — CALCIUM CARBONATE 1250 (500 CA) MG PO TABS: 1.0000 | ORAL_TABLET | Freq: Every day | ORAL | Status: DC

## 2017-09-26 MED ORDER — CALCITONIN (SALMON) 200 UNIT/ACT NA SOLN: 1.0000 | Freq: Every day | NASAL | 12 refills | Status: DC

## 2017-09-26 MED ORDER — ALPRAZOLAM 0.25 MG PO TABS: 0.2500 mg | ORAL_TABLET | Freq: Every day | ORAL | Status: DC | PRN

## 2017-09-26 MED ORDER — OXYCODONE HCL 5 MG PO TABS: 5.0000 mg | ORAL_TABLET | ORAL | Status: DC | PRN

## 2017-09-26 MED ORDER — WARFARIN SODIUM 5 MG PO TABS: 5.0000 mg | ORAL_TABLET | Freq: Once | ORAL | Status: DC

## 2017-09-26 NOTE — Social Work (Signed)
Clinical Social Worker facilitated patient discharge including contacting patient family and facility to confirm patient discharge plans.  Clinical information faxed to facility and family agreeable with plan.  CSW arranged ambulance transport via [facility] to [place] Room [facility]. RN to call [phone] with report prior to discharge.  Clinical Social Worker will sign off for now as social work intervention is no longer needed. Please consult [place] again if new need arises.  Alexander Mt, [place] Social Worker

## 2017-09-26 NOTE — Progress Notes (Signed)
Patient reports ongoing LBP, which she states is lower than her preoperative mid back pain. She has not been up other than to use restroom. I recommend getting more mobile with physical therapy today and seeing how she progresses. If reports substantial pain limiting her ability to ambulate and care for herself, additional imaging may be needed, as I do not feel that her current pain is related to her L4 compression fracture.

## 2017-09-26 NOTE — Clinical Social Work Placement (Addendum)
   CLINICAL SOCIAL WORK PLACEMENT  NOTE  Date:  09/26/2017  Patient Details  Name: Deanna Gonzales MRN: [id number] Date of Birth: Feb 08, 1928  Clinical Social Work is seeking post-discharge placement for this patient at the [place] level of care (*CSW will initial, date and re-position this form in  chart as items are completed):      Patient/family provided with [place] Work Department's list of facilities offering this level of care within the geographic area requested by the patient (or if unable, by the patient's family).      Patient/family informed of their freedom to choose among providers that offer the needed level of care, that participate in Medicare, Medicaid or managed care program needed by the patient, have an available bed and are willing to accept the patient.      Patient/family informed of [place]'s ownership interest in [facility] and [facility], as well as of the fact that they are under no obligation to receive care at these facilities.  PASRR submitted to EDS on       PASRR number received on 09/23/17     Existing PASRR number confirmed on       FL2 transmitted to all facilities in geographic area requested by pt/family on 09/13/17     FL2 transmitted to all facilities within larger geographic area on 09/24/17     Patient informed that his/her managed care company has contracts with or will negotiate with certain facilities, including the following:        Yes   Patient/family informed of bed offers received.  Patient chooses bed at [facility] [place]     Physician recommends and patient chooses bed at      Patient to be transferred to [facility] on 09/25/17.  Patient to be transferred to facility by [facility]     Patient family notified on 09/26/17 of transfer.  Name of family member notified:      Deanna Gonzales [phone], daughter in law at bedside    PHYSICIAN Please prepare  priority discharge summary, including medications     Additional Comment:    _______________________________________________ Alexander Mt, [place] 09/26/2017, 10:00 AM

## 2017-09-26 NOTE — Progress Notes (Addendum)
Family Medicine Teaching Service Daily Progress Note Intern Pager: [phone]  Patient name: Deanna Gonzales Medical record number: [id number] Date of birth: 1928/04/30 Age: 84 y.o. Gender: female  Primary Care Provider: Manfred Shirts, PA Consultants: orthopedic surgery  Code Status: full   Pt Overview and Major Events to Date:  2/13: Admitted 2/14: PT recc'd SNF, Dr. Lynann Bologna discussed rehab vs surgery next week w family 2/18: Re-eval by orthopedic surgery, plan surgery 2/20 2/20: Kyphoplasty   Assessment and Plan: Deanna Mashburnis a 84 y.o.femalepresenting with back pain following a recently diagnosed L4 compresion fracture.PMH is significant for A Fib (on warfarin), osteoporosis, hypothyroidism, HTN, and CKD.  Acute Back Pain 2/2 L4 Compression Fracture  Osteoporosis: POD #1, s/p kyphoplasty. Patient reports pain but is very groggy on exam.  -Ortho consulted; appreciate recs. Per orthopedics, safe for discharge. No restrictions and progress activity as tolerated. Can remove bandages in 5 days. Outpatient follow up in 2 weeks.  -Discontinue scheduled Oxycodone 5mg Q6H -Continue 5mg but q4hrs prn  -Continue Tylenol 650mg Q6Hscheduled -Continue baclofen 5mg  TID -Contiue calcitonin nasal spray once daily x 2wks (2/14- ) -Zofran prn nausea -LSO brace prn comfort  -Continue Ca 500mg  and Vit D 800 IU supplementation for osteoporosis treatment -Plan to startbisphosphonate 6 wks post-fractureif renal function permits -appreciate PT/OT recommendations- both recommending SNF  ParoxysmalAtrial Fibrillation Stable.Remains in NSR.INR1.58this morning -Continue home amiodarone 100mg  daily, diltiazem 24hr capsule 120mg  daily -Coumadin per pharmacy  -Daily PT-INR  Hypertension Chronic. Normotensive today,with BP of 120/65 -Continue home Triamterene-HCTZ 37.5-25mg every other day  CKD III Cr1.24(baseline 1.5-1.7).  -avoid NSAIDS, nephrotoxic agents -Avoid  bisphosphonates w/Cr Cl <30-35  Anxiety Chronic, stable -Continue home alprazolam 0.25mg  but daily prn as well as qhs, mirtazapine 15mg  qHS  Hypothyroidism Chronic, stable. Last TSH 3.5 03/2017.  -Continue levothyroxine132mcg daily(home dose 88 mcg)  Constipation Patient reports difficulty having BM since admission -miralax daily   FEN/GI:Heart healthy diet Prophylaxis:heparinper pharmacy, will hold prior to surgery  Disposition: possible discharge pending state after decrease in pain medications    Subjective:  Patient reports continued pain, feels pressure on back when laying in bed. Per family patient is very groggy   Objective: Temp:  [98 F (36.7 C)-99 F (37.2 C)] 98.6 F (37 C) (02/21 0408) Pulse Rate:  [69-76] 69 (02/21 0408) Resp:  [11-23] 15 (02/21 0408) BP: (116-166)/(55-79) 120/65 (02/21 0408) SpO2:  [88 %-99 %] 95 % (02/21 0408) Physical Exam: General: groggy, will fall asleep during exam, laying in bed  Cardiovascular: RRR, no MRG Respiratory: CTAB, no wheezes, rales or rhonchi  Abdomen: soft, non tender, non distended, bowel sounds normal  Extremities: no edema, non tender to palpation   Laboratory: Recent Labs  Lab 09/23/17 0300 09/24/17 0616 09/25/17 0431  WBC 8.9 9.8 7.5  HGB 13.4 14.1 13.0  HCT 41.8 42.9 41.6  PLT 231 217 229   Recent Labs  Lab 09/24/17 0616 09/25/17 0431 09/26/17 0747  NA 139 139 140  K 3.9 3.8 4.3  CL 103 104 106  CO2 26 26 25   BUN 20 22* 15  CREATININE 1.35* 1.53* 1.24*  CALCIUM 9.0 8.7* 8.7*  GLUCOSE 85 89 84     Ref. Range 09/26/2017 04:57  Prothrombin Time Latest Ref Range: 11.4 - 15.2 seconds 18.7 (H)  INR Unknown 1.58    Imaging/Diagnostic Tests: Dg Lumbar Spine 2-3 Views  Result Date: 09/25/2017 CLINICAL DATA:  L4 kyphoplasty. EXAM: LUMBAR SPINE - 2-3 VIEW; DG C-ARM 61-120 MIN COMPARISON:  CT of the lumbar spine  09/12/2017. FINDINGS: Four intraoperative images of the lumbar spine document  bipedicular spinal augmentation at L4. Methylmethacrylate extends to the superior endplate of L4 anteriorly without significant extrusion into the disc space. Cement is predominantly within the created cavities. There is some trabecular infiltration posteriorly on the right. Methylmethacrylate does extend to the posterior wall of the L4 vertebral body. IMPRESSION: 1. Intraoperative bipedicular spinal augmentation at L4 without radiographic evidence for complication. Electronically Signed   By: San Morelle M.D.   On: 09/25/2017 11:58   Ct Lumbar Spine Wo Contrast  Result Date: 09/12/2017 CLINICAL DATA:  Right hip pain for 1 week. Groin pain. EXAM: CT LUMBAR SPINE WITHOUT CONTRAST TECHNIQUE: Multidetector CT imaging of the lumbar spine was performed without intravenous contrast administration. Multiplanar CT image reconstructions were also generated. COMPARISON:  None. FINDINGS: Segmentation: 5 lumbar type vertebrae. Alignment: No static listhesis.  Mild kyphosis centered at L1. Vertebrae: Generalized osteopenia. Chronic L1 vertebral body compression fracture with approximately 60% height loss. Acute L4 vertebral body compression fracture with approximately 40% central height loss. Remainder the vertebral body heights are maintained. Paraspinal and other soft tissues: No paraspinal abnormality. Abdominal aortic atherosclerosis. Bilateral parapelvic cysts. Other: Mild osteoarthritis of bilateral sacroiliac joints. Disc levels: Disc spaces are maintained. No foraminal or central canal stenosis. IMPRESSION: 1. Acute L4 vertebral body compression fracture with approximately 40% central height loss. 2. Chronic L1 vertebral body compression fracture with approximately 60% height loss. Electronically Signed   By: Kathreen Devoid   On: 09/12/2017 16:03   Dg C-arm 1-60 Min  Result Date: 09/25/2017 CLINICAL DATA:  L4 kyphoplasty. EXAM: LUMBAR SPINE - 2-3 VIEW; DG C-ARM 61-120 MIN COMPARISON:  CT of the lumbar spine  09/12/2017. FINDINGS: Four intraoperative images of the lumbar spine document bipedicular spinal augmentation at L4. Methylmethacrylate extends to the superior endplate of L4 anteriorly without significant extrusion into the disc space. Cement is predominantly within the created cavities. There is some trabecular infiltration posteriorly on the right. Methylmethacrylate does extend to the posterior wall of the L4 vertebral body. IMPRESSION: 1. Intraoperative bipedicular spinal augmentation at L4 without radiographic evidence for complication. Electronically Signed   By: San Morelle M.D.   On: 09/25/2017 11:58     Caroline More, DO 09/26/2017, 9:37 AM PGY-1, [place] Intern pager: [phone], text pages welcome

## 2017-09-26 NOTE — Progress Notes (Signed)
ANTICOAGULATION CONSULT NOTE   Pharmacy Consult:  Coumadin Indication: atrial fibrillation  Allergies  Allergen Reactions  . Flexeril [Cyclobenzaprine] Anaphylaxis  . Vortioxetine Anaphylaxis  . Duloxetine Other (See Comments)    hypertension hypertension   . Lisinopril Other (See Comments)    unknown unknown   . Risedronate Other (See Comments)    arthralgia  . Risedronate Sodium Other (See Comments)    arthralgia arthralgia  . Adhesive [Tape] Rash    Gets red and welts  . Alendronate Nausea Only    Patient Measurements: Height: 5\' 8"  (172.7 cm) Weight: 155 lb (70.3 kg) IBW/kg (Calculated) : 63.9  Vital Signs: Temp: 98.6 F (37 C) (02/21 0408) Temp Source: Oral (02/21 0408) BP: 120/65 (02/21 0408) Pulse Rate: 69 (02/21 0408)  Labs: Recent Labs    09/24/17 0616 09/24/17 1649 09/25/17 0431 09/26/17 0457 09/26/17 0747  HGB 14.1  --  13.0  --   --   HCT 42.9  --  41.6  --   --   PLT 217  --  229  --   --   LABPROT 20.6*  --  19.4* 18.7*  --   INR 1.78  --  1.66 1.58  --   HEPARINUNFRC  --  0.28*  --   --   --   CREATININE 1.35*  --  1.53*  --  1.24*    Estimated Creatinine Clearance: 32.2 mL/min (A) (by C-G formula based on SCr of 1.24 mg/dL (H)).   Medical History: Past Medical History:  Diagnosis Date  . Age-related macular degeneration   . Anxiety   . Atrial fibrillation ([place])   . Basal cell carcinoma    "nose"  . Chronic lower back pain   . CKD (chronic kidney disease), stage III ([place])    [name] 09/18/2017  . Depression   . Family history of adverse reaction to anesthesia    "sister passed away when she was 51; from anesthesia" (09/18/2017)  . GERD (gastroesophageal reflux disease)   . History of blood transfusion    "related to OR"  . Hypertension   . Hypothyroidism   . Lumbar compression fracture ([place]) 09/06/2017   L4; Atraumatic/notes 09/18/2017  . Osteoarthritis    "all over" (09/18/2017)  . Osteoporosis   . Osteoporosis   .  Presence of permanent cardiac pacemaker ?2012; ?2016  . Thyroid disease       Assessment: 66 YOF with history of Afib on Coumadin PTA.  She was transitioned to IV heparin for kyphoplasty today 09/25/17.  Pharmacy consulted to resume Coumadin post-op.  INR sub-therapeutic as expected.  No bleeding reported.  Home Coumadin dose: 3mg  daily except 1.5mg  on Saturday  2/21 - INR - 18.7/1.58  Goal of Therapy:  INR 2-3 Monitor platelets by anticoagulation protocol: Yes   Plan:  Repeat Coumadin 5mg  PO today Daily PT / INR D/C daily CBC  Rober Minion, PharmD., MS Clinical Pharmacist Pager:  [phone] Thank you for allowing pharmacy to be part of this patients care team. 09/26/2017, 11:09 AM

## 2017-09-26 NOTE — Progress Notes (Signed)
Physical Therapy Treatment Patient Details Name: Deanna Gonzales MRN: [id number] DOB: May 27, 1928 Today's Date: 09/26/2017    History of Present Illness 84 y.o. female admitted 2/12 with low back pain that has been worsening over last 12 days. Found to have L4 compressive fracture, per Ortho MD will be awaiting progress over hospitalization before reassessing kyphoplasty vs non operative treatment.  PMH includes:  presence of permanent cardiac pacemaker, HTN, Osteoporosis, GERD, CKD stage 3.     PT Comments    Patient progressing well with therapy. Session focused on improving activity tolerance and reinforcing log roll and safe transfers. Patient demonstrating increased independence with all mobility, daughter in law is extremely pleased with her progress and relieved that her pain from this AM has subsided. Pt ambulating hallway with stand by assist. D/C to SNF today, anticipate she will do great and return to baseline soon.    Follow Up Recommendations  SNF;Supervision/Assistance - 24 hour     Equipment Recommendations  None recommended by PT    Recommendations for Other Services       Precautions / Restrictions Precautions Precautions: Fall Precaution Comments: TLSO Restrictions Weight Bearing Restrictions: No    Mobility  Bed Mobility Overal bed mobility: Needs Assistance Bed Mobility: Rolling;Sit to Sidelying Rolling: Supervision Sidelying to sit: Min assist     Sit to sidelying: Min assist General bed mobility comments: cues for log roll techinque, assist with lifting legs over EOB. otherwise patient rolls well.   Transfers Overall transfer level: Needs assistance Equipment used: Rolling walker (2 wheeled) Transfers: Sit to/from Stand Sit to Stand: Min guard         General transfer comment: now min guard for transfers  Ambulation/Gait Ambulation/Gait assistance: Min guard;Supervision Ambulation Distance (Feet): 100 Feet Assistive device: Rolling walker (2  wheeled) Gait Pattern/deviations: Step-through pattern;Decreased stride length Gait velocity: decreased   General Gait Details: cues for posture and increased stride length   Stairs            Wheelchair Mobility    Modified Rankin (Stroke Patients Only)       Balance Overall balance assessment: Needs assistance Sitting-balance support: No upper extremity supported;Feet supported Sitting balance-Leahy Scale: Fair     Standing balance support: No upper extremity supported;During functional activity Standing balance-Leahy Scale: Fair                              Cognition Arousal/Alertness: Awake/alert Behavior During Therapy: WFL for tasks assessed/performed Overall Cognitive Status: Within Functional Limits for tasks assessed                                        Exercises      General Comments        Pertinent Vitals/Pain Pain Assessment: Faces Faces Pain Scale: Hurts little more Pain Location: back Pain Descriptors / Indicators: Guarding;Sore Pain Intervention(s): Limited activity within patient's tolerance;Monitored during session;Premedicated before session    Home Living                      Prior Function            PT Goals (current goals can now be found in the care plan section) Acute Rehab PT Goals Patient Stated Goal: go to rehab then home PT Goal Formulation: With patient/family Time For Goal Achievement: 09/27/17 Potential  to Achieve Goals: Fair Progress towards PT goals: Progressing toward goals    Frequency    Min 5X/week      PT Plan Current plan remains appropriate    Co-evaluation              AM-PAC PT "6 Clicks" Daily Activity  Outcome Measure  Difficulty turning over in bed (including adjusting bedclothes, sheets and blankets)?: Unable Difficulty moving from lying on back to sitting on the side of the bed? : Unable Difficulty sitting down on and standing up from a chair  with arms (e.g., wheelchair, bedside commode, etc,.)?: Unable Help needed moving to and from a bed to chair (including a wheelchair)?: A Little Help needed walking in hospital room?: A Little Help needed climbing 3-5 steps with a railing? : A Little 6 Click Score: 12    End of Session Equipment Utilized During Treatment: Gait belt;Back brace Activity Tolerance: Patient tolerated treatment well Patient left: with call bell/phone within reach;in chair Nurse Communication: Mobility status PT Visit Diagnosis: Unsteadiness on feet (R26.81);Other abnormalities of gait and mobility (R26.89);Muscle weakness (generalized) (M62.81);Pain Pain - part of body: (low back)     Time: 5670-1410 PT Time Calculation (min) (ACUTE ONLY): 30 min  Charges:  $Gait Training: 8-22 mins $Therapeutic Activity: 8-22 mins                    G Codes:       Reinaldo Berber, PT, DPT Acute Rehab Services Pager: [phone]     Reinaldo Berber 09/26/2017, 12:49 PM

## 2017-09-26 NOTE — Op Note (Signed)
Deanna Gonzales, Deanna Gonzales                MEDICAL RECORD NO.:  [id number]  PHYSICIAN:  [name], MD           DATE OF BIRTH:  DATE OF PROCEDURE:  09/25/2017                              OPERATIVE REPORT   PREOPERATIVE DIAGNOSIS:  L4 compression fracture.  POSTOPERATIVE DIAGNOSIS:  L4 compression fracture.  PROCEDURE:  L4 kyphoplasty.  SURGEON:  [name], MD.  ASSISTANTPricilla Holm, PA-C.  ANESTHESIA:  General endotracheal anesthesia.  COMPLICATIONS:  None.  DISPOSITION:  Stable.  ESTIMATED BLOOD LOSS:  Minimal.  INDICATIONS FOR SURGERY:  Briefly, Ms. Rovira is a very pleasant 84- year-old female, who did have an onset of pain in her low back in early February.  Her pain was rather severe.  She also had pain in the region of her right hip.  Her right hip pain did resolve after an injection, but she continued to have ongoing pain in her back.  Her CAT scan did clearly reveal an acute L4 compression fracture.  She was admitted to the Medicine Service, at which point I was consulted, and at which point I evaluated her.  I did feel that her low back pain was secondary to her compression fracture.  We did attempt a trial of nonoperative treatment, but she continued to feel rather debilitated as a result of her ongoing pain.  Given her ongoing pain and dysfunction, we did discuss proceeding with the procedure noted above.  I did fully discuss the procedure with the patient and her family, and they did wish to proceed.  OPERATIVE DETAILS:  On September 25, 2017, the patient was brought to Surgery and general endotracheal anesthesia was administered.  The patient was placed prone on a well-padded flat Jackson bed with gel rolls placed under the patient's chest and hips.  Antibiotics were given.  AP and lateral fluoroscopy was brought into the field.  The L4 pedicles were marked out in the usual fashion.  After a time-out procedure was performed, I did advance  Jamshidis across the L4 pedicles on the right and left sides.  I then drilled through the Jamshidis.  I then inserted kyphoplasty balloons and I was able to inflate the balloons with approximately 4 mL of contrast.  Partial restoration of the superior endplate was noted.  At this point, after the cement was mixed, a total of approximately 8 mL of cement was injected, half on the right, and half on the left.  Excellent interdigitation of cement was identified.  There was noted to be interdigitation of cement into the fractured upper endplate as well.  There was no abnormal extravasation of cement identified.  The cement was then allowed to harden.  At this point, the wound was irrigated and closed using 4-0 Monocryl.  Bacitracin and a sterile dressing were applied.  The patient was then awoken from general endotracheal anesthesia and transferred to Recovery in stable condition.     [name], MD     MD/MEDQ  D:  09/25/2017  T:  09/25/2017  Job:  [id number]

## 2020-07-05 ENCOUNTER — Emergency Department (HOSPITAL_COMMUNITY): Payer: Medicare Other

## 2020-07-05 ENCOUNTER — Emergency Department (HOSPITAL_COMMUNITY)
Admission: EM | Admit: 2020-07-05 | Discharge: 2020-07-06 | Disposition: A | Discharge status: Home / Self Care | Payer: Medicare Other | Attending: Emergency Medicine | Admitting: Emergency Medicine

## 2020-07-05 DIAGNOSIS — N183 Chronic kidney disease, stage 3 unspecified: Secondary | ICD-10-CM | POA: Diagnosis not present

## 2020-07-05 DIAGNOSIS — Z7901 Long term (current) use of anticoagulants: Secondary | ICD-10-CM | POA: Insufficient documentation

## 2020-07-05 DIAGNOSIS — I129 Hypertensive chronic kidney disease with stage 1 through stage 4 chronic kidney disease, or unspecified chronic kidney disease: Secondary | ICD-10-CM | POA: Diagnosis not present

## 2020-07-05 DIAGNOSIS — Z950 Presence of cardiac pacemaker: Secondary | ICD-10-CM | POA: Diagnosis not present

## 2020-07-05 DIAGNOSIS — Z20822 Contact with and (suspected) exposure to covid-19: Secondary | ICD-10-CM | POA: Diagnosis not present

## 2020-07-05 DIAGNOSIS — R1011 Right upper quadrant pain: Secondary | ICD-10-CM | POA: Diagnosis not present

## 2020-07-05 DIAGNOSIS — R109 Unspecified abdominal pain: Secondary | ICD-10-CM | POA: Diagnosis not present

## 2020-07-05 DIAGNOSIS — Z79899 Other long term (current) drug therapy: Secondary | ICD-10-CM | POA: Insufficient documentation

## 2020-07-05 DIAGNOSIS — K8020 Calculus of gallbladder without cholecystitis without obstruction: Secondary | ICD-10-CM | POA: Insufficient documentation

## 2020-07-05 DIAGNOSIS — I4891 Unspecified atrial fibrillation: Secondary | ICD-10-CM | POA: Diagnosis not present

## 2020-07-05 DIAGNOSIS — Z8522 Personal history of malignant neoplasm of nasal cavities, middle ear, and accessory sinuses: Secondary | ICD-10-CM | POA: Diagnosis not present

## 2020-07-05 DIAGNOSIS — L02212 Cutaneous abscess of back [any part, except buttock]: Secondary | ICD-10-CM | POA: Diagnosis not present

## 2020-07-05 DIAGNOSIS — M545 Low back pain, unspecified: Secondary | ICD-10-CM | POA: Diagnosis present

## 2020-07-05 DIAGNOSIS — E039 Hypothyroidism, unspecified: Secondary | ICD-10-CM | POA: Insufficient documentation

## 2020-07-05 LAB — BASIC METABOLIC PANEL
Anion gap: 9 (ref 5–15)
BUN: 19 mg/dL (ref 8–23)
CO2: 26 mmol/L (ref 22–32)
Calcium: 9.1 mg/dL (ref 8.9–10.3)
Chloride: 105 mmol/L (ref 98–111)
Creatinine, Ser: 1.49 mg/dL — ABNORMAL HIGH (ref 0.44–1.00)
GFR, Estimated: 33 mL/min — ABNORMAL LOW (ref >60)
Glucose, Bld: 102 mg/dL — ABNORMAL HIGH (ref 70–99)
Potassium: 3.7 mmol/L (ref 3.5–5.1)
Sodium: 140 mmol/L (ref 135–145)

## 2020-07-05 LAB — URINALYSIS, ROUTINE W REFLEX MICROSCOPIC
Bilirubin Urine: NEGATIVE
Glucose, UA: NEGATIVE mg/dL
Hgb urine dipstick: NEGATIVE
Ketones, ur: NEGATIVE mg/dL
Leukocytes,Ua: NEGATIVE
Nitrite: NEGATIVE
Protein, ur: NEGATIVE mg/dL
Specific Gravity, Urine: 1.006 (ref 1.005–1.030)
pH: 7 (ref 5.0–8.0)

## 2020-07-05 LAB — HEPATIC FUNCTION PANEL
ALT: 15 U/L (ref 0–44)
AST: 20 U/L (ref 15–41)
Albumin: 3.9 g/dL (ref 3.5–5.0)
Alkaline Phosphatase: 56 U/L (ref 38–126)
Bilirubin, Direct: 0.1 mg/dL (ref 0.0–0.2)
Indirect Bilirubin: 0.6 mg/dL (ref 0.3–0.9)
Total Bilirubin: 0.7 mg/dL (ref 0.3–1.2)
Total Protein: 7.2 g/dL (ref 6.5–8.1)

## 2020-07-05 LAB — TROPONIN I (HIGH SENSITIVITY)
Troponin I (High Sensitivity): 4 ng/L (ref <18)
Troponin I (High Sensitivity): <2 ng/L (ref <18)

## 2020-07-05 LAB — CBC WITH DIFFERENTIAL/PLATELET
Abs Immature Granulocytes: 0.09 10*3/uL — ABNORMAL HIGH (ref 0.00–0.07)
Basophils Absolute: 0.1 10*3/uL (ref 0.0–0.1)
Basophils Relative: 1 %
Eosinophils Absolute: 0.3 10*3/uL (ref 0.0–0.5)
Eosinophils Relative: 4 %
HCT: 43.8 % (ref 36.0–46.0)
Hemoglobin: 13.9 g/dL (ref 12.0–15.0)
Immature Granulocytes: 1 %
Lymphocytes Relative: 15 %
Lymphs Abs: 1.1 10*3/uL (ref 0.7–4.0)
MCH: 32.1 pg (ref 26.0–34.0)
MCHC: 31.7 g/dL (ref 30.0–36.0)
MCV: 101.2 fL — ABNORMAL HIGH (ref 80.0–100.0)
Monocytes Absolute: 0.8 10*3/uL (ref 0.1–1.0)
Monocytes Relative: 11 %
Neutro Abs: 5 10*3/uL (ref 1.7–7.7)
Neutrophils Relative %: 68 %
Platelets: 224 10*3/uL (ref 150–400)
RBC: 4.33 MIL/uL (ref 3.87–5.11)
RDW: 14.2 % (ref 11.5–15.5)
WBC: 7.4 10*3/uL (ref 4.0–10.5)
nRBC: 0 % (ref 0.0–0.2)

## 2020-07-05 LAB — LACTIC ACID, PLASMA
Lactic Acid, Venous: 0.9 mmol/L (ref 0.5–1.9)
Lactic Acid, Venous: 0.8 mmol/L (ref 0.5–1.9)

## 2020-07-05 LAB — RESP PANEL BY RT-PCR (FLU A&B, COVID) ARPGX2
Influenza A by PCR: NEGATIVE
Influenza B by PCR: NEGATIVE
SARS Coronavirus 2 by RT PCR: NEGATIVE

## 2020-07-05 LAB — LIPASE, BLOOD: Lipase: 25 U/L (ref 11–51)

## 2020-07-05 MED ORDER — MORPHINE SULFATE (PF) 4 MG/ML IV SOLN
4.0000 mg | Freq: Once | INTRAVENOUS | Status: AC
  Administered 2020-07-05: 4 mg via INTRAVENOUS
  Filled 2020-07-05: qty 1

## 2020-07-05 MED ORDER — LIDOCAINE HCL (PF) 1 % IJ SOLN
5.0000 mL | Freq: Once | INTRAMUSCULAR | Status: DC
  Filled 2020-07-05: qty 30

## 2020-07-05 MED ORDER — FENTANYL CITRATE (PF) 100 MCG/2ML IJ SOLN
25.0000 ug | Freq: Once | INTRAMUSCULAR | Status: AC
  Administered 2020-07-05: 25 ug via INTRAVENOUS
  Filled 2020-07-05: qty 2

## 2020-07-05 MED ORDER — LIDOCAINE-EPINEPHRINE-TETRACAINE (LET) TOPICAL GEL
3.0000 mL | Freq: Once | TOPICAL | Status: DC
  Filled 2020-07-05: qty 3

## 2020-07-05 MED ORDER — SODIUM CHLORIDE 0.9 % IV BOLUS
500.0000 mL | Freq: Once | INTRAVENOUS | Status: AC
  Administered 2020-07-05: 500 mL via INTRAVENOUS

## 2020-07-05 MED ORDER — IOHEXOL 350 MG/ML SOLN
80.0000 mL | Freq: Once | INTRAVENOUS | Status: AC | PRN
  Administered 2020-07-05: 80 mL via INTRAVENOUS

## 2020-07-05 NOTE — ED Provider Notes (Signed)
[facility] DEPT Provider Note   CSN: [id number] Arrival date & time: 07/05/20  1837     History Chief Complaint  Patient presents with   Back Pain    Deanna Gonzales is a 86 y.o. female history of A. fib, on Coumadin, CKD, GERD, hypertension, back pain, hysterectomy, appendectomy.  Patient reports that she was making a flower arrangement this morning when she had sudden onset lower abdominal pain she reports pain began around her right hip and then radiated over to her left hip it was sharp constant moderate intensity no clear inciting event or aggravating or alleviating factors.  Minimal improvement with oxycodone today.  Denies similar pain in the past.  Denies fever/chills, fall/injury, chest pain/shortness of breath, headache, dysuria/hematuria, nausea/vomiting, diarrhea, numbness/tingling, weakness, saddle paresthesias, bowel/bladder incontinence, urinary retention or any additional concerns. HPI     Past Medical History:  Diagnosis Date   Age-related macular degeneration    Anxiety    Atrial fibrillation ([place])    Basal cell carcinoma    "nose"   Chronic lower back pain    CKD (chronic kidney disease), stage III ([place])    [name] 09/18/2017   Depression    Family history of adverse reaction to anesthesia    "sister passed away when she was 66; from anesthesia" (09/18/2017)   GERD (gastroesophageal reflux disease)    History of blood transfusion    "related to OR"   Hypertension    Hypothyroidism    Lumbar compression fracture ([place]) 09/06/2017   L4; Atraumatic/notes 09/18/2017   Osteoarthritis    "all over" (09/18/2017)   Osteoporosis    Osteoporosis    Presence of permanent cardiac pacemaker ?2012; ?2016   Thyroid disease     Patient Active Problem List   Diagnosis Date Noted   Lumbar compression fracture ([place]) 09/18/2017   Atrial fibrillation ([place]) 09/18/2017   Anxiety 09/18/2017   Acute midline low back  pain     Past Surgical History:  Procedure Laterality Date   ABDOMINAL EXPLORATION SURGERY  ?1960   "benign tumor on intestines"   ABDOMINAL HYSTERECTOMY  1980   "partial"   APPENDECTOMY     ATRIAL ABLATION SURGERY     BASAL CELL CARCINOMA EXCISION     nose   CARDIAC CATHETERIZATION     CATARACT EXTRACTION W/ INTRAOCULAR LENS  IMPLANT, BILATERAL Bilateral    INSERT / REPLACE / REMOVE PACEMAKER  ?2012; ?2016   KYPHOPLASTY N/A 09/25/2017   Procedure: KYPHOPLASTY LUMBAR FOUR;  Surgeon: [name], MD;  Location: [facility];  Service: Orthopedics;  Laterality: N/A;   PACEMAKER REMOVAL  ?2012   "1st one got infected; didn't have a pacemaker for 2-3 years"     OB History   No obstetric history on file.     Family History  Problem Relation Age of Onset   Anxiety disorder Mother    Heart disease Mother    Hypertension Father    Heart disease Father    Anxiety disorder Sister    Aneurysm Sister     Social History   Tobacco Use   Smoking status: Never Smoker   Smokeless tobacco: Never Used  Vaping Use   Vaping Use: Never used  Substance Use Topics   Alcohol use: No   Drug use: No    Home Medications Prior to Admission medications   Medication Sig Start Date End Date Taking? Authorizing Provider  ALPRAZolam (XANAX) 0.25 MG tablet Take 0.25 mg by mouth at bedtime.  08/21/17  Yes [provider]  amiodarone (PACERONE) 100 MG tablet Take 200 mg by mouth daily.    Yes [provider]  cholecalciferol 1000 units tablet Take 1 tablet (1,000 Units total) by mouth daily. 09/27/17  Yes Tammi Klippel, Sherin, DO  diltiazem (CARDIZEM) 120 MG tablet Take 120 mg by mouth daily. 06/26/17  Yes [provider]  escitalopram (LEXAPRO) 5 MG tablet Take 5 mg by mouth every other day.  06/06/20  Yes [provider]  gabapentin (NEURONTIN) 100 MG capsule Take 200 mg by mouth 2 (two) times daily.    Yes [provider]    HYDROcodone-acetaminophen (NORCO/VICODIN) 5-325 MG tablet Take 1 tablet by mouth every 6 (six) hours as needed for moderate pain. for pain 07/04/20  Yes [provider]  levothyroxine (SYNTHROID, LEVOTHROID) 100 MCG tablet Take 100 mcg by mouth daily. 07/02/17  Yes [provider]  losartan (COZAAR) 25 MG tablet Take 25 mg by mouth 2 (two) times daily. 06/26/17  Yes [provider]  RESTASIS 0.05 % ophthalmic emulsion Place 1 drop into both eyes 2 (two) times daily. 06/06/20  Yes [provider]  triamterene-hydrochlorothiazide (MAXZIDE-25) 37.5-25 MG tablet Take 1 tablet by mouth every other day. Patient taking differently: Take 1 tablet by mouth daily.  09/28/17  Yes Tammi Klippel, Sherin, DO  warfarin (COUMADIN) 3 MG tablet Take 3 mg by mouth daily.  06/18/17  Yes [provider]  baclofen 5 MG TABS Take 5 mg by mouth 3 (three) times daily. Patient not taking: Reported on 07/06/2020 09/26/17   Caroline More, DO  calcitonin, salmon, (MIACALCIN/FORTICAL) 200 UNIT/ACT nasal spray Place 1 spray into alternate nostrils daily. Patient not taking: Reported on 07/06/2020 09/26/17   Caroline More, DO  calcium carbonate (OS-CAL - DOSED IN MG OF ELEMENTAL CALCIUM) 1250 (500 Ca) MG tablet Take 1 tablet (500 mg of elemental calcium total) by mouth daily with breakfast. Patient not taking: Reported on 07/06/2020 09/27/17   Caroline More, DO  oxyCODONE (OXY IR/ROXICODONE) 5 MG immediate release tablet Take 1 tablet (5 mg total) by mouth every 4 (four) hours as needed for severe pain. Patient not taking: Reported on 07/06/2020 09/26/17   Caroline More, DO  polyethylene glycol [facility] / GLYCOLAX) packet Take 17 g by mouth daily. Patient not taking: Reported on 07/06/2020 09/27/17   Caroline More, DO  senna-docusate (SENOKOT-S) 8.6-50 MG tablet Take 1 tablet by mouth 2 (two) times daily as needed for mild constipation. Patient not taking: Reported on 07/06/2020 09/26/17    Caroline More, DO    Allergies    Flexeril [cyclobenzaprine], Vortioxetine, Duloxetine, Lisinopril, Risedronate, Risedronate sodium, Adhesive [tape], and Alendronate  Review of Systems   Review of Systems Ten systems are reviewed and are negative for acute change except as noted in the HPI  Physical Exam Updated Vital Signs BP (!) 163/87 (BP Location: Right Arm)    Pulse 79    Temp 98.2 F (36.8 C) (Oral)    Resp 19    Ht 5\' 6"  (1.676 m)    Wt 68 kg    SpO2 92%    BMI 24.21 kg/m   Physical Exam Constitutional:      General: She is not in acute distress.    Appearance: Normal appearance. She is well-developed. She is not ill-appearing or diaphoretic.  HENT:     Head: Normocephalic and atraumatic.  Eyes:     General: Vision grossly intact. Gaze aligned appropriately.     Pupils: Pupils  are equal, round, and reactive to light.  Neck:     Trachea: Trachea and phonation normal.  Cardiovascular:     Pulses:          Dorsalis pedis pulses are 2+ on the right side and 2+ on the left side.  Pulmonary:     Effort: Pulmonary effort is normal. No accessory muscle usage or respiratory distress.     Breath sounds: Normal breath sounds and air entry.  Chest:     Chest wall: No deformity, tenderness or crepitus.  Abdominal:     General: Bowel sounds are normal. There is no distension.     Palpations: Abdomen is soft.     Tenderness: There is abdominal tenderness in the right upper quadrant, right lower quadrant and suprapubic area. There is no guarding or rebound. Negative signs include Murphy's sign.  Musculoskeletal:        General: Normal range of motion.     Cervical back: Normal range of motion.     Comments: No midline C/T/L spinal tenderness to palpation, no paraspinal muscle tenderness, no deformity, crepitus, or step-off noted.  All major joints mobilized with proper range of motion for age without pain.  Feet:     Right foot:     Protective Sensation: 3 sites tested. 3 sites  sensed.     Left foot:     Protective Sensation: 3 sites tested. 3 sites sensed.  Skin:    General: Skin is warm and dry.     Capillary Refill: Capillary refill takes less than 2 seconds.          Comments: 2 mm area of fluctuance to the upper thoracic back up on midline.  Mild erythema present without streaking.  Minimally tender.  Small area of purulent drainage present.  Neurological:     Mental Status: She is alert.     GCS: GCS eye subscore is 4. GCS verbal subscore is 5. GCS motor subscore is 6.     Comments: Speech is clear and goal oriented, follows commands Major Cranial nerves without deficit, no facial droop Moves extremities without ataxia, coordination intact  Psychiatric:        Behavior: Behavior normal.     ED Results / Procedures / Treatments   Labs (all labs ordered are listed, but only abnormal results are displayed) Labs Reviewed  CBC WITH DIFFERENTIAL/PLATELET - Abnormal; Notable for the following components:      Result Value   MCV 101.2 (*)    Abs Immature Granulocytes 0.09 (*)    All other components within normal limits  BASIC METABOLIC PANEL - Abnormal; Notable for the following components:   Glucose, Bld 102 (*)    Creatinine, Ser 1.49 (*)    GFR, Estimated 33 (*)    All other components within normal limits  URINALYSIS, ROUTINE W REFLEX MICROSCOPIC - Abnormal; Notable for the following components:   Color, Urine STRAW (*)    All other components within normal limits  RESP PANEL BY RT-PCR (FLU A&B, COVID) ARPGX2  LIPASE, BLOOD  HEPATIC FUNCTION PANEL  LACTIC ACID, PLASMA  LACTIC ACID, PLASMA  I-STAT CHEM 8, ED  TROPONIN I (HIGH SENSITIVITY)  TROPONIN I (HIGH SENSITIVITY)    EKG EKG Interpretation  Date/Time:  Tuesday July 05 2020 19:21:51 EST Ventricular Rate:  70 PR Interval:    QRS Duration: 137 QT Interval:  434 QTC Calculation: 469 R Axis:   -50 Text Interpretation: Sinus rhythm Prolonged PR interval Nonspecific IVCD with  LAD  Left ventricular hypertrophy Confirmed by Quintella Reichert [phone]) on 07/05/2020 7:43:04 PM   Radiology DG Pelvis Portable  Result Date: 07/05/2020 CLINICAL DATA:  Weakness, back and bilateral hip pain EXAM: PORTABLE PELVIS 1-2 VIEWS COMPARISON:  04/09/2012 FINDINGS: Supine frontal view of the pelvis includes both hips. There is stable symmetrical axial joint space narrowing of the hips. No fracture, subluxation, or dislocation. Sacroiliac joints are normal. Previous L4 vertebral augmentation. IMPRESSION: 1. Stable bilateral hip osteoarthritis. 2. No acute displaced fracture. 3. Previous L4 vertebral augmentation. Electronically Signed   By: Randa Ngo M.D.   On: 07/05/2020 20:11   DG Chest Portable 1 View  Result Date: 07/05/2020 CLINICAL DATA:  86 year old female with weakness. EXAM: PORTABLE CHEST 1 VIEW COMPARISON:  Chest radiograph dated 09/04/2019. FINDINGS: Emphysema with right infrahilar scarring. No focal consolidation, pleural effusion, pneumothorax. The cardiac silhouette is within limits. Atherosclerotic calcification of the aorta. Bilateral hilar prominence, likely pulmonary hypertension. Right pectoral pacemaker device. Atherosclerotic calcification of the aorta. No acute osseous pathology. IMPRESSION: 1. No acute cardiopulmonary process. 2. Emphysema. Electronically Signed   By: Anner Crete M.D.   On: 07/05/2020 20:10   CT Angio Chest/Abd/Pel for Dissection W and/or Wo Contrast  Result Date: 07/05/2020 CLINICAL DATA:  86 year old female with abdominal pain. Concern for aortic dissection. EXAM: CT ANGIOGRAPHY CHEST, ABDOMEN AND PELVIS TECHNIQUE: Non-contrast CT of the chest was initially obtained. Multidetector CT imaging through the chest, abdomen and pelvis was performed using the standard protocol during bolus administration of intravenous contrast. Multiplanar reconstructed images and MIPs were obtained and reviewed to evaluate the vascular anatomy. CONTRAST:  73mL  OMNIPAQUE IOHEXOL 350 MG/ML SOLN COMPARISON:  Chest radiograph dated 07/05/2020. CT abdomen pelvis dated 08/18/2012. FINDINGS: CTA CHEST FINDINGS Cardiovascular: Top-normal cardiac size. No pericardial effusion. Right pectoral pacemaker device. There is mild atherosclerotic calcification of the thoracic aorta. No aneurysmal dilatation or dissection. There is a left-sided aortic arch with aberrant right subclavian artery anatomy. The origins of the great vessels of the aortic arch appear patent as visualized. The central pulmonary arteries appear unremarkable. Mediastinum/Nodes: No hilar or mediastinal adenopathy. The esophagus is grossly unremarkable. No mediastinal fluid collection. Lungs/Pleura: There are bibasilar linear and streaky densities, likely atelectasis. Infiltrate is less likely but not excluded clinical correlation is recommended. No lobar consolidation, pleural effusion, pneumothorax. The central airways are patent. Musculoskeletal: Osteopenia with degenerative changes of the spine. Old healed sternal fracture. No acute osseous pathology. Review of the MIP images confirms the above findings. CTA ABDOMEN AND PELVIS FINDINGS VASCULAR Aorta: Moderate atherosclerotic calcification of the aorta. No aneurysmal dilatation or dissection. No periaortic fluid collection or inflammation. Celiac: The celiac axis and its major branches are patent. SMA: Atherosclerotic calcification of the origin of the SMA. The SMA is patent. Renals: Atherosclerotic calcification of the origin of the renal arteries. The renal arteries are patent. IMA: Patent without evidence of aneurysm, dissection, vasculitis or significant stenosis. Inflow: Atherosclerotic calcification. No aneurysmal dilatation or dissection. Veins: No obvious venous abnormality within the limitations of this arterial phase study. Review of the MIP images confirms the above findings. NON-VASCULAR No intra-abdominal free air or free fluid. Hepatobiliary:  Multiple hepatic hypodense lesions which are not characterized on this CT but likely represent cysts. The measure up to 4 cm in the left lobe of the liver. Additional smaller hypodense lesions are not characterized. No calcified gallstone. Mild thickened appearance of the gallbladder wall which may be related to underdistention. Ultrasound may provide better evaluation if there  is high clinical concern for acute cholecystitis. Pancreas: Unremarkable. No pancreatic ductal dilatation or surrounding inflammatory changes. Spleen: Normal in size without focal abnormality. Adrenals/Urinary Tract: There is a 2 cm indeterminate right adrenal nodule. The left adrenal gland is unremarkable. Bilateral renal parapelvic cysts. There is no hydronephrosis on either side. The visualized ureters and urinary bladder appear unremarkable. Stomach/Bowel: There is no bowel obstruction or active inflammation. Moderate stool throughout the colon. The appendix is not visualized with certainty. No inflammatory changes identified in the right lower quadrant. Lymphatic: No adenopathy. Reproductive: Hysterectomy. Other: None Musculoskeletal: Osteopenia with degenerative changes of the spine. Old L4 compression fracture and vertebroplasty. Old appearing compression fracture of L1. No acute osseous pathology. Review of the MIP images confirms the above findings. IMPRESSION: 1. No acute intrathoracic, abdominal, or pelvic pathology. No CT evidence of aortic dissection or aneurysm. 2. Left-sided aortic arch with aberrant right subclavian artery anatomy. 3. Bibasilar linear and streaky densities, likely atelectasis. Infiltrate is less likely but not excluded clinical correlation is recommended. 4. Aortic Atherosclerosis (ICD10-I70.0). Electronically Signed   By: Anner Crete M.D.   On: 07/05/2020 21:48   US Abdomen Limited RUQ (LIVER/GB)  Result Date: 07/05/2020 CLINICAL DATA:  Right upper quadrant abdominal pain EXAM: ULTRASOUND ABDOMEN  LIMITED RIGHT UPPER QUADRANT COMPARISON:  CT 07/05/2020 FINDINGS: Gallbladder: Cholelithiasis noted. The gallbladder, however, is not distended and there is no gallbladder wall thickening or pericholecystic fluid identified. The sonographic Percell Miller sign is reportedly negative. Common bile duct: Diameter: 3-4 mm in mid diameter Liver: The liver demonstrates normal parenchymal echogenicity. Multiple simple parenchymal cysts are seen scattered within both hepatic lobes. No solid intrahepatic mass, however, is identified. No intrahepatic biliary ductal dilation portal vein is patent on color Doppler imaging with normal direction of blood flow towards the liver. Other: None. IMPRESSION: Cholelithiasis without sonographic evidence of acute cholecystitis. Multiple simple parenchymal cysts throughout the liver, safely considered benign. No solid liver mass identified. Electronically Signed   By: Fidela Salisbury MD   On: 07/05/2020 22:55    Procedures Procedures (including critical care time)  Medications Ordered in ED Medications  lidocaine-EPINEPHrine-tetracaine (LET) topical gel (3 mLs Topical Refused 07/05/20 2302)  lidocaine (PF) (XYLOCAINE) 1 % injection 5 mL (5 mLs Infiltration Refused 07/05/20 2302)  fentaNYL (SUBLIMAZE) injection 25 mcg (25 mcg Intravenous Given 07/05/20 2025)  sodium chloride 0.9 % bolus 500 mL (500 mLs Intravenous New Bag/Given 07/05/20 2112)  iohexol (OMNIPAQUE) 350 MG/ML injection 80 mL (80 mLs Intravenous Contrast Given 07/05/20 2117)  morphine 4 MG/ML injection 4 mg (4 mg Intravenous Given 07/05/20 2212)    ED Course  I have reviewed the triage vital signs and the nursing notes.  Pertinent labs & imaging results that were available during my care of the patient were reviewed by me and considered in my medical decision making (see chart for details).    MDM Rules/Calculators/A&P                         Additional history obtained from: 1. Nursing notes from this  visit. 2. Daughter at bedside. 3. Electronic medical record. ------------------- I ordered, reviewed and interpreted labs which include: Lactic acid level within normal limits x2, reassuring. High-sensitivity troponin initial 4, delta negative is reassuring. Urinalysis without evidence of infection. LFTs within normal limits. Lipase within normal limits. CBC without leukocytosis to suggest infection, no anemia. BMP shows no emergent electrolyte derangement, AKI or gap.  Creatinine of 1.4 appears baseline.  Covid/influenza panel negative.  CXR:  IMPRESSION:  1. No acute cardiopulmonary process.  2. Emphysema.   DG Pelvis:  IMPRESSION:  1. Stable bilateral hip osteoarthritis.  2. No acute displaced fracture.  3. Previous L4 vertebral augmentation.   CT Angio Chest/Abd/Pelvis Dissection study:  IMPRESSION:  1. No acute intrathoracic, abdominal, or pelvic pathology. No CT  evidence of aortic dissection or aneurysm.  2. 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  3. Bibasilar linear and streaky densities, likely atelectasis.  Infiltrate is less likely but not excluded clinical correlation is  recommended.  4. Aortic Atherosclerosis (ICD10-I70.0).   RUQ [place]:    IMPRESSION:  Cholelithiasis without sonographic evidence of acute cholecystitis.    Multiple simple parenchymal cysts throughout the liver, safely  considered benign. No solid liver mass identified.   EKG: Sinus rhythm Prolonged PR interval Nonspecific IVCD with LAD Left ventricular hypertrophy Confirmed by Quintella Reichert [phone]) on 07/05/2020 7:43:04 PM ---------------- No clear cause of patient's pain today, she reports pain is improved following medications in the ER.  She was reassessed she is resting comfortably no acute distress vital signs stable.  I offered patient incision and drainage for the abscess present in the upper back.  Patient and her daughter at bedside asked that they  instead follow-up with a dermatologist on Thursday as scheduled for treatment of that area, will start patient on doxycycline as there is a mild overlying area of cellulitis.  Patient does report some continued back pain with some positions, she is wanting to be discharged but concerned that she does live alone.  To have will p.o. challenge and ambulate patient prior to disposition.  Discussed plan with Dr. Ralene Bathe who agrees. - Patient had severe pain with going from a lying to a sitting position when she was standing her pain had improved and she was able to ambulate a short distance but pain returned when she was sitting back down.  She had a few saltines but did have continued abdominal pain.  I reassessed the patient she is resting in bed no acute distress.  Shared decision making made between patient, her daughter and myself.  There is no clear cause for patient's pain today she continues to have severe pain especially with sitting despite multiple medications given in the ER.  Family and patient are requesting admission, will consult hospitalist service for observation admission. - Consult with Dr. Roel Cluck, patient has been accepted to hospitalist service.  Note: Portions of this report may have been transcribed using voice recognition software. Every effort was made to ensure accuracy; however, inadvertent computerized transcription errors may still be present. Final Clinical Impression(s) / ED Diagnoses Final diagnoses:  RUQ abdominal pain  Abdominal pain, unspecified abdominal location  Abscess of back  Calculus of gallbladder without cholecystitis without obstruction    Rx / DC Orders ED Discharge Orders    None       Gari Crown 07/06/20 0009    Quintella Reichert, MD 07/06/20 2240

## 2020-07-05 NOTE — ED Notes (Signed)
Pt ate 3 packs of saltines and drank a cup of water.  Pt then ambulated around the room with pain only when sitting up in the bed.

## 2020-07-05 NOTE — ED Triage Notes (Signed)
Presents via EMS with c/o back pain radiating to both hips. States she also had one episode of RLQ pain which has since resolved. PT took Oxycodone around noon for the pain.

## 2020-07-06 MED ORDER — GOLYTELY 236 G PO SOLR: 4.0000 L | Freq: Once | ORAL | 0 refills | Status: AC

## 2020-07-06 MED ORDER — POLYETHYLENE GLYCOL 3350 17 G PO PACK
17.0000 g | PACK | Freq: Every day | ORAL | 0 refills | Status: DC
Start: 2020-07-06 — End: 2020-07-26

## 2020-07-06 NOTE — Discharge Instructions (Addendum)
Return if she is having any problems.

## 2020-07-06 NOTE — ED Provider Notes (Signed)
Patient had been evaluated by hospitalist, patient family have decided to go home.  She will be discharged with prescription for GoLYTELY, bowel regimen discussed with family.   Delora Fuel, MD 17/53/01 [phone]

## 2020-07-08 ENCOUNTER — Emergency Department (HOSPITAL_COMMUNITY)
Admission: EM | Admit: 2020-07-08 | Discharge: 2020-07-08 | Disposition: A | Discharge status: Home / Self Care | Payer: Medicare Other | Attending: Emergency Medicine | Admitting: Emergency Medicine

## 2020-07-08 ENCOUNTER — Other Ambulatory Visit: Payer: Self-pay

## 2020-07-08 DIAGNOSIS — Z5321 Procedure and treatment not carried out due to patient leaving prior to being seen by health care provider: Secondary | ICD-10-CM | POA: Insufficient documentation

## 2020-07-08 DIAGNOSIS — K5900 Constipation, unspecified: Secondary | ICD-10-CM | POA: Insufficient documentation

## 2020-07-08 LAB — COMPREHENSIVE METABOLIC PANEL
ALT: 16 U/L (ref 0–44)
AST: 17 U/L (ref 15–41)
Albumin: 3.8 g/dL (ref 3.5–5.0)
Alkaline Phosphatase: 55 U/L (ref 38–126)
Anion gap: 11 (ref 5–15)
BUN: 23 mg/dL (ref 8–23)
CO2: 25 mmol/L (ref 22–32)
Calcium: 9.1 mg/dL (ref 8.9–10.3)
Chloride: 105 mmol/L (ref 98–111)
Creatinine, Ser: 1.33 mg/dL — ABNORMAL HIGH (ref 0.44–1.00)
GFR, Estimated: 38 mL/min — ABNORMAL LOW (ref >60)
Glucose, Bld: 102 mg/dL — ABNORMAL HIGH (ref 70–99)
Potassium: 3.6 mmol/L (ref 3.5–5.1)
Sodium: 141 mmol/L (ref 135–145)
Total Bilirubin: 1 mg/dL (ref 0.3–1.2)
Total Protein: 7.3 g/dL (ref 6.5–8.1)

## 2020-07-08 LAB — CBC
HCT: 42.3 % (ref 36.0–46.0)
Hemoglobin: 13.5 g/dL (ref 12.0–15.0)
MCH: 32.5 pg (ref 26.0–34.0)
MCHC: 31.9 g/dL (ref 30.0–36.0)
MCV: 101.7 fL — ABNORMAL HIGH (ref 80.0–100.0)
Platelets: 230 10*3/uL (ref 150–400)
RBC: 4.16 MIL/uL (ref 3.87–5.11)
RDW: 14.6 % (ref 11.5–15.5)
WBC: 8.9 10*3/uL (ref 4.0–10.5)
nRBC: 0 % (ref 0.0–0.2)

## 2020-07-08 LAB — LIPASE, BLOOD: Lipase: 19 U/L (ref 11–51)

## 2020-07-08 NOTE — ED Triage Notes (Signed)
Pt POV with daughter.   Per Pt was seen at [facility] on Tuesday night, dx with constipation.  Prescribed laxative, only able to tolerate partial dose.  Pt reports LBM Sunday 07/03/20.

## 2020-07-11 ENCOUNTER — Other Ambulatory Visit: Payer: Self-pay | Admitting: Orthopedic Surgery

## 2020-07-11 DIAGNOSIS — M545 Low back pain, unspecified: Secondary | ICD-10-CM

## 2020-07-12 ENCOUNTER — Telehealth: Payer: Self-pay

## 2020-07-26 ENCOUNTER — Telehealth: Payer: Self-pay

## 2020-07-26 NOTE — Telephone Encounter (Signed)
I called patient, and then her daughter, Deanna Gonzales, to let them know Dr. Mayer Camel has re-sent the order to [place] asking [place] to get Deanna Gonzales scheduled for a lumbar myelogram.  I did screen the patient's medications and drug allergies, and she has no medications she would have to hold for the procedure should they all decide to proceed.  (She is on Warfarin, but that does not need to be held for this procedure.)  That said, Deanna Gonzales is quite concerned about the 24 hours of bedrest to follow a myelogram, stating a PT saw the patient just yesterday and the patient was unable to lie down flat on a bed; she sleeps in a recliner.  Deanna Gonzales also said she and her brother called Deanna Gonzales cardiologist and received confirmation that her pacemaker IS MRI compatible, so they are trying to get Dr. Mayer Camel to change the myelogram to an MRI.  Deanna Gonzales is also quite concerned that her mom could get a spinal headache if she is not able to lie down for 24 hours, adding on to her mom's already significant pain problems.  I did point out to Deanna Gonzales that each study would require her mom to lie flat on an imaging table for some amount of time, although the MRI would be much shorter than the myelogram.  At this point the patient and her family still do not wish to proceed with the myelogram, at least not until trying to get an MRI.

## 2020-07-27 ENCOUNTER — Other Ambulatory Visit (HOSPITAL_COMMUNITY): Payer: Self-pay | Admitting: Orthopedic Surgery

## 2020-07-27 DIAGNOSIS — M545 Low back pain, unspecified: Secondary | ICD-10-CM

## 2020-08-09 ENCOUNTER — Ambulatory Visit (HOSPITAL_COMMUNITY)
Admission: RE | Admit: 2020-08-09 | Discharge: 2020-08-09 | Disposition: A | Discharge status: Home / Self Care | Payer: Medicare Other | Source: Ambulatory Visit | Attending: Orthopedic Surgery | Admitting: Orthopedic Surgery

## 2020-08-09 ENCOUNTER — Other Ambulatory Visit: Payer: Self-pay

## 2020-08-09 DIAGNOSIS — M545 Low back pain, unspecified: Secondary | ICD-10-CM | POA: Insufficient documentation

## 2020-08-09 NOTE — Progress Notes (Signed)
Patient here today for MRI with pacer. [facility] express sent to [facility]- [employer] Rep and [facility]- Cardiology PA. Orders received for DOO at 85. Will re-program once scan is complete.

## 2020-08-12 ENCOUNTER — Telehealth: Payer: Self-pay

## 2020-08-12 NOTE — Telephone Encounter (Signed)
Spoke with patient's daughter [name] and scheduled an in-person Palliative Consult for 08/19/20 @ 2PM   COVID screening was negative. No pets in home. Patient lives alone, but your children are staying with her at this time.   Consent obtained; updated Outlook/Netsmart/Team List and Epic.

## 2020-08-12 NOTE — Telephone Encounter (Signed)
Attempted to contact patient's daughter [name] to schedule a Palliative Care consult appointment. No answer left a message to return call.

## 2020-08-19 ENCOUNTER — Other Ambulatory Visit: Payer: Medicare Other | Admitting: Internal Medicine

## 2020-08-19 ENCOUNTER — Other Ambulatory Visit: Payer: Self-pay

## 2020-08-19 DIAGNOSIS — M546 Pain in thoracic spine: Secondary | ICD-10-CM

## 2020-08-19 DIAGNOSIS — K5903 Drug induced constipation: Secondary | ICD-10-CM

## 2020-08-19 DIAGNOSIS — Z7189 Other specified counseling: Secondary | ICD-10-CM

## 2020-08-19 DIAGNOSIS — Z515 Encounter for palliative care: Secondary | ICD-10-CM

## 2020-08-19 DIAGNOSIS — M545 Low back pain, unspecified: Secondary | ICD-10-CM

## 2020-08-23 NOTE — Progress Notes (Signed)
[facility] Pall[facility] Consult Note Telephone: [phone]  Fax: [phone]  PATIENT NAME: Deanna Gonzales DOB: 1927/09/21 MRN: [id number]  PRIMARY CARE PROVIDER:   Manfred Shirts, PA  REFERRING PROVIDER:  Manfred Shirts, [place],  [place] [address]  RESPONSIBLE PARTY:   Self     RECOMMENDATIONS and PLAN:  Palliative care encounter Z 55.1  1. Ad vance care planning:  Explanation of palliative and hospice care and reviewed goals of patient.  She would like to return to her most recent state of independence, decrease back pain and continue to reside in her home with assistance of her family members. Advanced directives were reviewed and she would like to discuss final decisions with her children.  No desire for a feeding tube. Code status will remain as full code at this time.  Re-address goals of care on next visit in approximately 1-2  months.  2.  Back pain:  Related to T-spine fracture.  Encouraged scheduled Tylenol 500mg  every AM and bedtime.  Use Hydrocodone 1/2 - 1 tablet if pain is at a level > 5 out of 10 as needed.  Consider use of hospital bed to assist in repositioning and ease of entering and exiting bed. Continue home PT.  3.  Constipation: Chronic and exacerbated with use of narcotics for back pain.  Continue new Rx of Linzess, improve hydration and dietary fiber. Monitor status.  I spent 45 minutes providing this consultation,  from 1400 to 1445. More than 50% of the time in this consultation was spent coordinating communication with patient and DIL.   HISTORY OF PRESENT ILLNESS:  Deanna Gonzales is a 87 y.o. year old female with multiple medical problems including significant osteoporosis and fluid retention.She reports recurrent non-traumatic T-spine compression fracture which has produced a great deal of back pain.  Current pain level is a 5/10.  Patient is unable to get into her own bed due to increased back pain when  attempting to do so. No issues with fluid retention since return from the hospital. She is normally self sufficient within her home.  Family members have been assisting with meals and ADLs.  Palliative Care was asked to help address goals of care.   CODE STATUS: Full code  PPS: 40% HOSPICE ELIGIBILITY/DIAGNOSIS: TBD  PAST MEDICAL HISTORY:  Past Medical History:  Diagnosis Date  . Age-related macular degeneration   . Anxiety   . Atrial fibrillation ([facility])   . Basal cell carcinoma    "nose"  . Chronic lower back pain   . CKD (chronic kidney disease), stage III ([facility])    [name] 09/18/2017  . Depression   . Family history of adverse reaction to anesthesia    "sister passed away when she was 46; from anesthesia" (09/18/2017)  . GERD (gastroesophageal reflux disease)   . History of blood transfusion    "related to OR"  . Hypertension   . Hypothyroidism   . Lumbar compression fracture ([facility]) 09/06/2017   L4; Atraumatic/notes 09/18/2017  . Osteoarthritis    "all over" (09/18/2017)  . Osteoporosis   . Osteoporosis   . Presence of permanent cardiac pacemaker ?2012; ?2016  . Thyroid disease     SOCIAL HX: Lives alone at home Social History   Tobacco Use  . Smoking status: Never Smoker  . Smokeless tobacco: Never Used  Substance Use Topics  . Alcohol use: No    ALLERGIES:  Allergies  Allergen Reactions  . Flexeril [Cyclobenzaprine]  Anaphylaxis  . Vortioxetine Anaphylaxis  . Duloxetine Other (See Comments)    hypertension    . Risedronate Sodium Other (See Comments)    arthralgia   . Lisinopril Other (See Comments)    unknown  . Adhesive [Tape] Rash    Gets red and welts  . Alendronate Nausea Only     PERTINENT MEDICATIONS:  Outpatient Encounter Medications as of 08/19/2020  Medication Sig  . ALPRAZolam (XANAX) 0.25 MG tablet Take 0.25 mg by mouth at bedtime.  Marland Kitchen amiodarone (PACERONE) 100 MG tablet Take 200 mg by mouth daily.   . cholecalciferol 1000 units tablet Take 1  tablet (1,000 Units total) by mouth daily.  Marland Kitchen diltiazem (CARDIZEM) 120 MG tablet Take 120 mg by mouth daily.  Marland Kitchen gabapentin (NEURONTIN) 100 MG capsule Take 200 mg by mouth 2 (two) times daily.   Marland Kitchen HYDROcodone-acetaminophen (NORCO/VICODIN) 5-325 MG tablet Take 1 tablet by mouth every 6 (six) hours as needed for moderate pain. for pain  . levothyroxine (SYNTHROID, LEVOTHROID) 100 MCG tablet Take 100 mcg by mouth daily.  Marland Kitchen linaclotide (LINZESS) 145 MCG CAPS capsule Take by mouth.  . losartan (COZAAR) 25 MG tablet Take 25 mg by mouth 2 (two) times daily.  . RESTASIS 0.05 % ophthalmic emulsion Place 1 drop into both eyes 2 (two) times daily.  Marland Kitchen triamterene-hydrochlorothiazide (MAXZIDE-25) 37.5-25 MG tablet Take 1 tablet by mouth every other day. (Patient taking differently: Take 1 tablet by mouth daily.)  . warfarin (COUMADIN) 3 MG tablet Take 3 mg by mouth daily.    No facility-administered encounter medications on file as of 08/19/2020.    PHYSICAL EXAM:   General: NAD, fatigued appearing well nourished elderly female sitting in recliner Cardiovascular: regular rate and rhythm Pulmonary: clear ant fields Abdomen: soft, nontender, + bowel sounds Extremities: no edema, joint deformities of fingers Skin: exposed skin is intact Neurological: alert and oriented. Fully engaged in conversation  Gonzella Lex, NP-C

## 2020-08-26 ENCOUNTER — Telehealth: Payer: Self-pay

## 2020-08-26 NOTE — Telephone Encounter (Signed)
Received message to call son, Deanna Gonzales. Call placed. VM left with call back information

## 2020-09-29 ENCOUNTER — Telehealth: Payer: Self-pay | Admitting: Student

## 2020-09-29 NOTE — Telephone Encounter (Signed)
Palliative NP left message for daughter to set up appointment regarding paperwork previous NP had given family. Awaiting return call.

## 2020-10-06 ENCOUNTER — Other Ambulatory Visit: Payer: Self-pay

## 2020-10-06 ENCOUNTER — Other Ambulatory Visit: Payer: Medicare Other | Admitting: Student

## 2020-10-06 DIAGNOSIS — Z515 Encounter for palliative care: Secondary | ICD-10-CM

## 2020-10-06 NOTE — Progress Notes (Signed)
  [facility] Palliative [facility] Consult Note Telephone: [phone]  Fax: [phone]  PATIENT NAME: Deanna Gonzales [address] [place] [address] [phone] (home)  DOB: 11/02/1927 MRN: [id number]  PRIMARY CARE PROVIDER:    Beane, Lori M, PA,  [address] [place] [address] [phone]  REFERRING PROVIDER:   Beane, Lori M, PA [address] [place],  [place] [address] [phone]  RESPONSIBLE PARTY:   Extended Emergency Contact Information Primary Emergency Contact: Nifong,Tammy  [place] Mobile Phone: [phone] Relation: Daughter Secondary Emergency Contact: Desrosiers,Ernie  [place] Mobile Phone: [phone] Relation: Son  I met face to face with patient and family in patient's home.   ASSESSMENT AND RECOMMENDATIONS:   Advance Care Planning: Visit at the request of Lori Beane, PA for palliative consult. Visit consisted of building trust and discussions on Palliative care medicine as specialized medical care for people living with serious illness, aimed at facilitating improved quality of life through symptoms relief, assisting with advance care planning and establishing goals of care. Education provided on Palliative Medicine vs. Hospice services. Palliative care will continue to provide support to patient, family and the medical team.  Goal of care: To regain strength, be comfortable.  Directives: MOST, DNR.   Symptom Management:   Pain-patient with chronic back pain. History of T-spine compression fracture, lumbar compression fracture, OA. She reports pain is improving. Recommend continuing acetaminophen BID for pain 5 or less, hydrocodone acetaminophen 5/325mg one tab every 6 hours prn for pain > 5/10. Continue physical therapy as directed.  Constipation-patient with chronic constipation. Recommend adequate fluid intake, balanced diet with fiber. Increase Linzess to daily until she is having  regular bowel movements and then go back to alternating every other day Linzess, with Miralax. Ambulate as tolerated.   Covid-19 infection-complete antibiotic (doxycycline) on Saturday. Continue albuterol inhaler 2 puffs every 6 hours prn for cough/shortness of breath.  Follow up Palliative Care Visit: Palliative care will continue to follow for complex decision making and symptom management. Return in 6 weeks or prn.  Family /Caregiver/Community Supports: [facility] OT/PT. Palliative Medicine will continue to provide support.   I spent 40 minutes providing this consultation, from 10:00am to 10:40am. Time includes time spent with patient/family, chart review, provider coordination, and documentation. More than 50% of the time in this consultation was spent counseling and coordinating communication.   CHIEF COMPLAINT: Complete advanced directives, palliative medicine follow up visit.  History obtained from review of EMR, discussion with primary team, and  interview with family. Records reviewed and summarized bellow.  HISTORY OF PRESENT ILLNESS:  Deanna Gonzales is a 87 y.o. year old female with multiple medical problems including non-traumatic T-spine compression fracture, anxiety, age related macular degeneration, atrial fibrillation, chronic lower back pain, depression, hypertension, hypothyroidism, lumbar compression fracture, OA, osteoporosis. Palliative Care was asked to follow this patient by consultation request of Beane, Lori M, PA to help address advance care planning and goals of care. This is a follow up visit.  Patient resides at home; family has been assisting with care and alternate staying with her at night. Daughter Tammy states patient was doing much better but had a set back with recent Covid-19 infection. Symptoms started 10+ days ago; she has lingering cough and was started on albuterol inhaler yesterday. Tammy states cough is starting to improve. Patient will complete  antibiotic on Saturday. She states her back pain is improving; today pain is mild. She is receiving acetaminophen   if tylenol is 5 or less, greater than 5, she will take hydrocodone-acetaminophen prn. She endorses constipation; taking Lnzess alternating with Miralax.   CODE STATUS: DNR  PPS: 40%  HOSPICE ELIGIBILITY/DIAGNOSIS: TBD  ROS   General: NAD EYES: denies vision changes ENMT: denies dysphagia Cardiovascular: denies chest pain Pulmonary: cough, denies increased SOB Abdomen: fair appetite, + constipation GU: denies dysuria MSK:  no falls reported Skin: denies rashes or wounds Neurological: endorses weakness Psych: Endorses positive mood Heme/lymph/immuno: denies bruises, abnormal bleeding   Physical Exam:  Constitutional: NAD General: A & O x 4;  frail appearing, thin EYES: anicteric sclera, lids intact, no discharge  ENMT: slight HOH,oral mucous membranes moist CV: RRR, no LE edema Pulmonary: Right bases slightly diminished, otherwise clear lung sounds,  no increased work of breathing, occasional np cough, sats 97% on room air Abdomen: Bowel sounds normoactive x 4 GU: deferred MSK: mild sarcopenia, ambulatory with walker Skin: warm and dry, no rashes or wounds on visible skin Neuro: Generalized weakness Psych: pleasant, non anxious affect today Hem/lymph/immuno: no widespread bruising   PAST MEDICAL HISTORY:  Past Medical History:  Diagnosis Date  . Age-related macular degeneration   . Anxiety   . Atrial fibrillation ([facility])   . Basal cell carcinoma    "nose"  . Chronic lower back pain   . CKD (chronic kidney disease), stage III ([facility])    /notes 09/18/2017  . Depression   . Family history of adverse reaction to anesthesia    "sister passed away when she was 37; from anesthesia" (09/18/2017)  . GERD (gastroesophageal reflux disease)   . History of blood transfusion    "related to OR"  . Hypertension   . Hypothyroidism   . Lumbar compression fracture ([facility])  09/06/2017   L4; Atraumatic/notes 09/18/2017  . Osteoarthritis    "all over" (09/18/2017)  . Osteoporosis   . Osteoporosis   . Presence of permanent cardiac pacemaker ?2012; ?2016  . Thyroid disease     SOCIAL HX:  Social History   Tobacco Use  . Smoking status: Never Smoker  . Smokeless tobacco: Never Used  Substance Use Topics  . Alcohol use: No   FAMILY HX:  Family History  Problem Relation Age of Onset  . Anxiety disorder Mother   . Heart disease Mother   . Hypertension Father   . Heart disease Father   . Anxiety disorder Sister   . Aneurysm Sister     ALLERGIES:  Allergies  Allergen Reactions  . Flexeril [Cyclobenzaprine] Anaphylaxis  . Vortioxetine Anaphylaxis  . Duloxetine Other (See Comments)    hypertension    . Risedronate Sodium Other (See Comments)    arthralgia   . Lisinopril Other (See Comments)    unknown  . Adhesive [Tape] Rash    Gets red and welts  . Alendronate Nausea Only     PERTINENT MEDICATIONS:  Outpatient Encounter Medications as of 10/06/2020  Medication Sig  . ALPRAZolam (XANAX) 0.25 MG tablet Take 0.25 mg by mouth at bedtime.  . amiodarone (PACERONE) 100 MG tablet Take 200 mg by mouth daily.   . cholecalciferol 1000 units tablet Take 1 tablet (1,000 Units total) by mouth daily.  . diltiazem (CARDIZEM) 120 MG tablet Take 120 mg by mouth daily.  . gabapentin (NEURONTIN) 100 MG capsule Take 200 mg by mouth 2 (two) times daily.   . HYDROcodone-acetaminophen (NORCO/VICODIN) 5-325 MG tablet Take 1 tablet by mouth every 6 (six) hours as needed for moderate pain. for pain  .   levothyroxine (SYNTHROID, LEVOTHROID) 100 MCG tablet Take 100 mcg by mouth daily.  . linaclotide (LINZESS) 145 MCG CAPS capsule Take by mouth.  . losartan (COZAAR) 25 MG tablet Take 25 mg by mouth 2 (two) times daily.  . RESTASIS 0.05 % ophthalmic emulsion Place 1 drop into both eyes 2 (two) times daily.  . triamterene-hydrochlorothiazide (MAXZIDE-25) 37.5-25 MG tablet  Take 1 tablet by mouth every other day. (Patient taking differently: Take 1 tablet by mouth daily.)  . warfarin (COUMADIN) 3 MG tablet Take 3 mg by mouth daily.   . [DISCONTINUED] calcium carbonate (OS-CAL - DOSED IN MG OF ELEMENTAL CALCIUM) 1250 (500 Ca) MG tablet Take 1 tablet (500 mg of elemental calcium total) by mouth daily with breakfast. (Patient not taking: Reported on 07/06/2020)   No facility-administered encounter medications on file as of 10/06/2020.     Thank you for the opportunity to participate in the care of Ms. Schoenfelder. The palliative care team will continue to follow. Please call our office at [phone] if we can be of additional assistance.  LaToya S Rivers, NP, AGPCNP-C                                      

## 2021-02-28 IMAGING — MR MR LUMBAR SPINE W/O CM
4 of 5 series · 26 of 48 positions shown · non-contrast
Comparison: Correlation made with lumbar spine CT from 8878

CLINICAL DATA: Low back pain

EXAM:
MRI LUMBAR SPINE WITHOUT CONTRAST
TECHNIQUE: Multiplanar, multisequence MR imaging of the lumbar spine was
performed. No intravenous contrast was administered.

[Series 5: T2 · sagittal · 4.0mm · 0.73mm/px · 6 of 16 slices shown (1 of 2)]
[im 1/16]
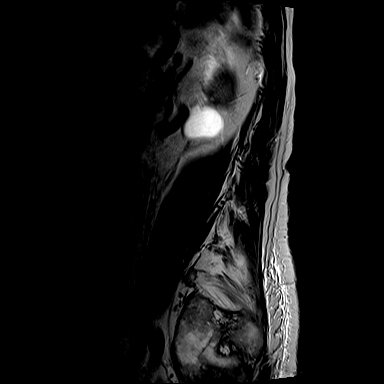
[im 4/16]
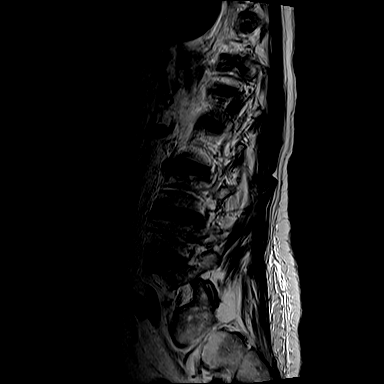
[im 7/16]
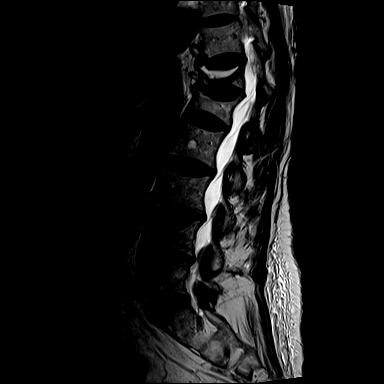
[im 10/16]
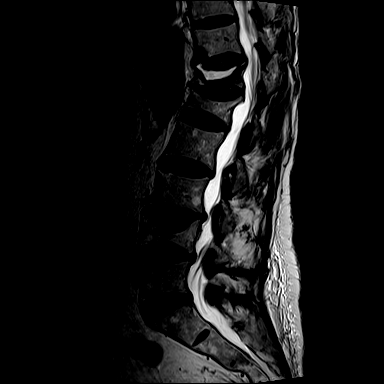
[im 13/16]
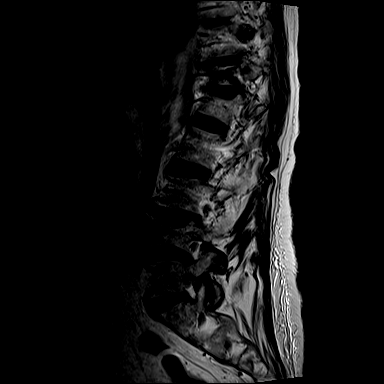
[im 16/16]
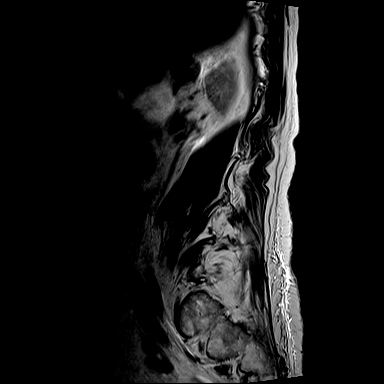

[Series 7: T1 · sagittal · 4.0mm · 0.88mm/px · 6 of 16 slices shown (1 of 2)]
[im 1/16]
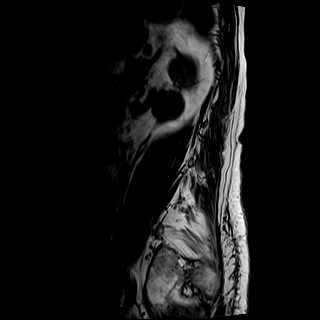
[im 4/16]
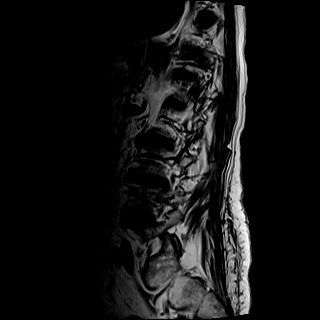
[im 7/16]
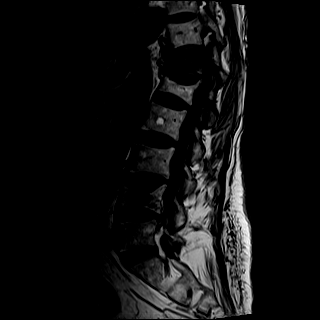
[im 10/16]
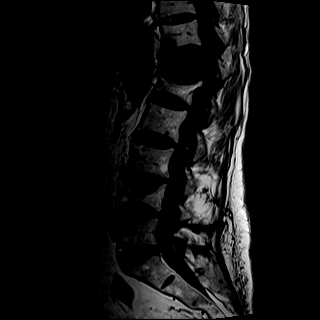
[im 13/16]
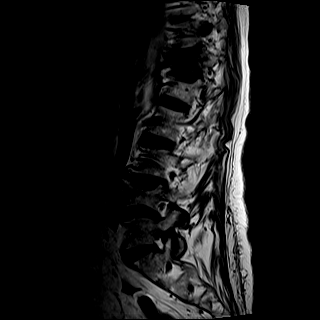
[im 16/16]
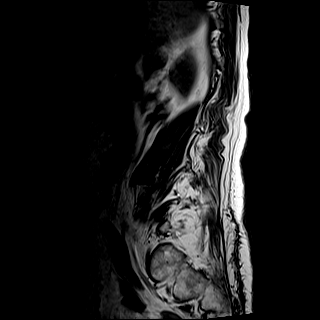

[Series 8: T2 · axial · 4.0mm · 0.57mm/px · z∈[-60,+154]mm · 9 of 38 slices shown (2 of 2)]
[im 1/38]
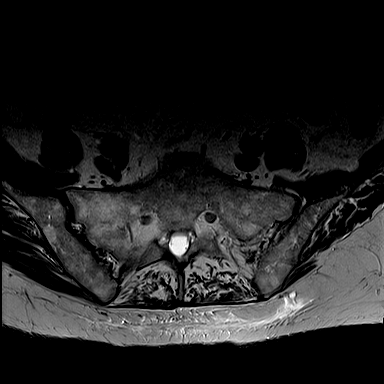
[im 6/38]
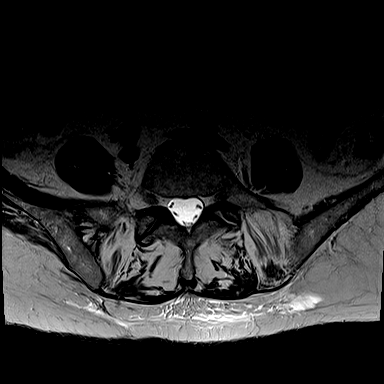
[im 11/38]
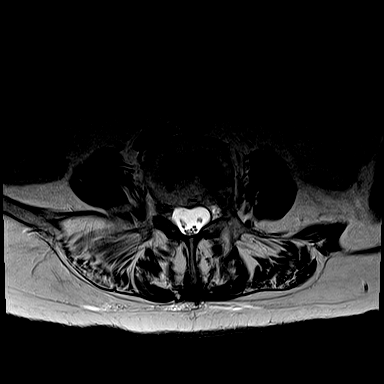
[im 16/38]
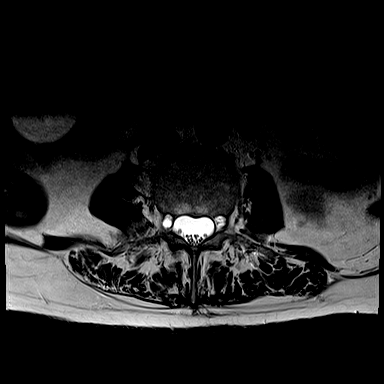
[im 19/38]
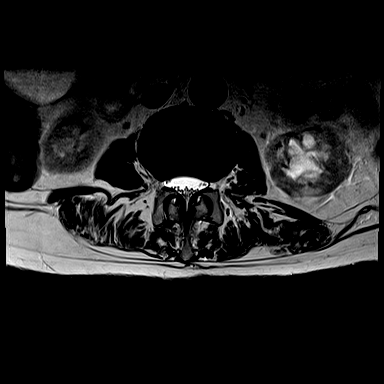
[im 22/38]
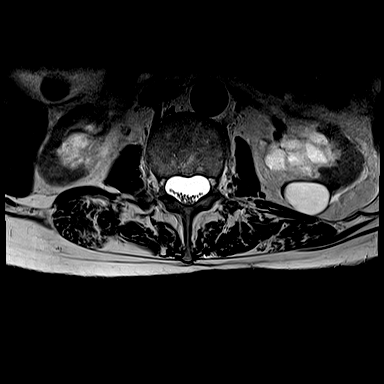
[im 27/38]
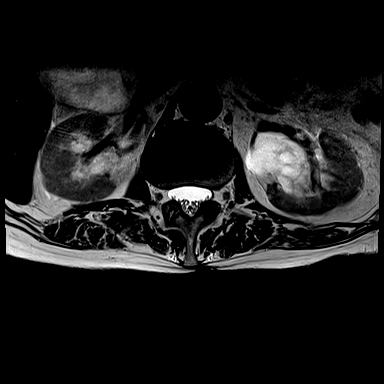
[im 32/38]
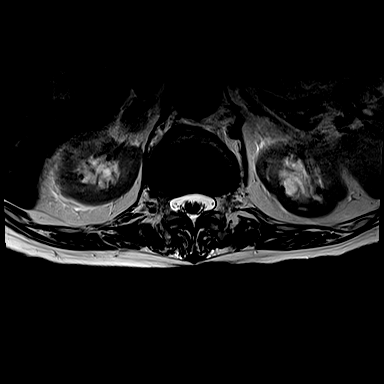
[im 38/38]
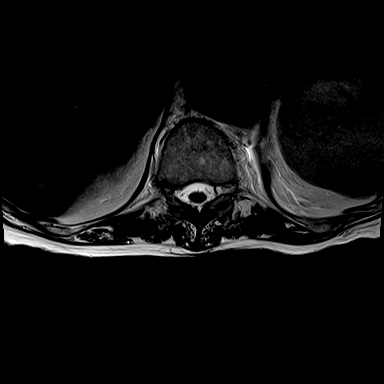

[Series 9: T1 · axial · 4.0mm · 0.34mm/px · z∈[-60,+124]mm · 5 of 38 slices shown (2 of 2)]
[im 1/38]
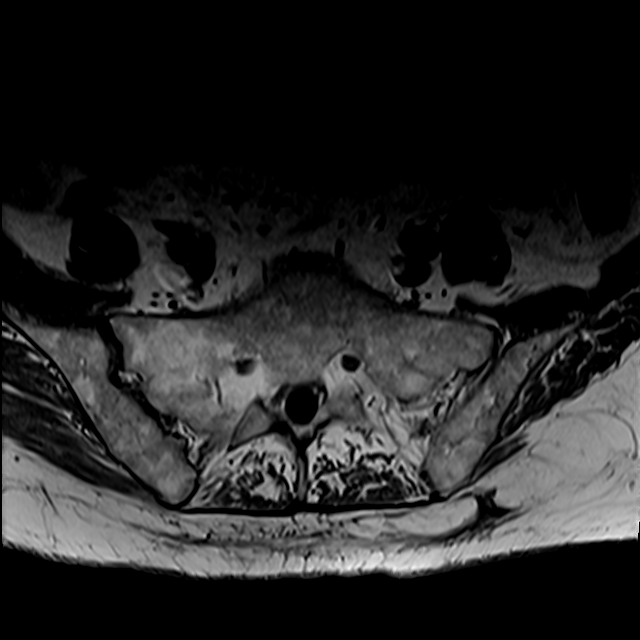
[im 6/38]
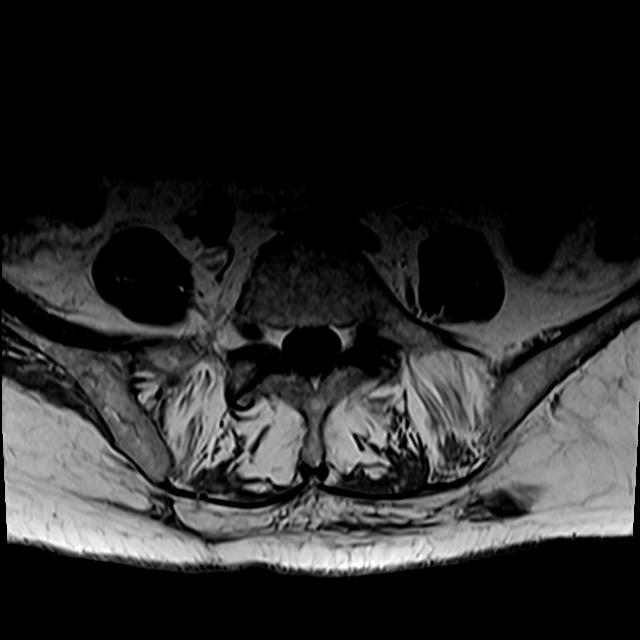
[im 11/38]
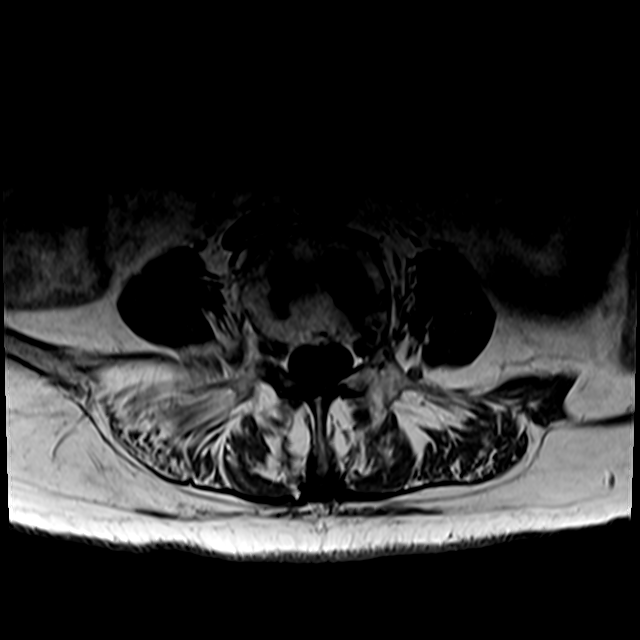
[im 19/38]
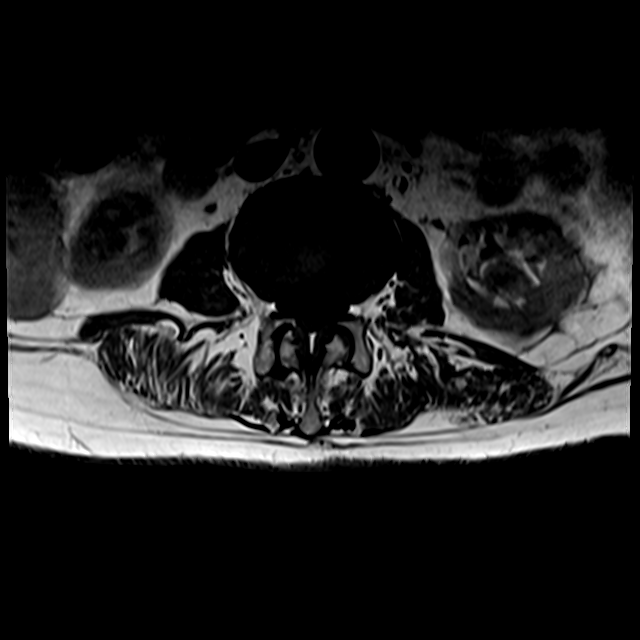
[im 32/38]
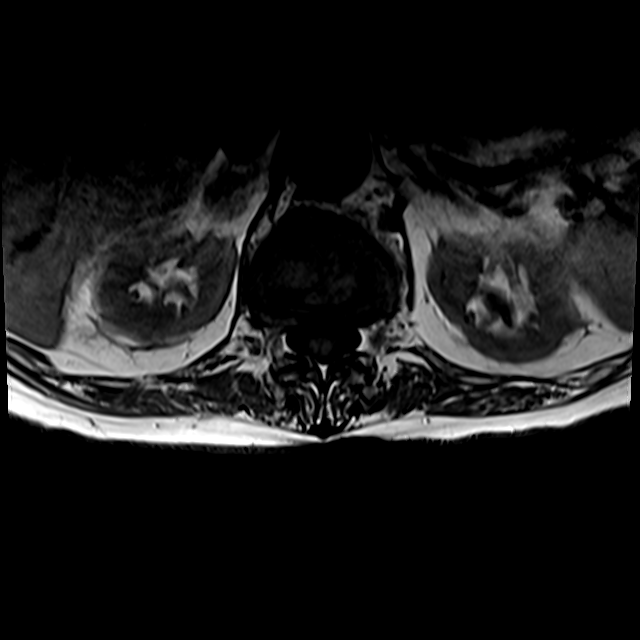

[26 of 48 positions shown; findings below may reference images not displayed]

FINDINGS: Segmentation:  Standard.

Alignment: Anteroposterior alignment is maintained apart from
compression fracture related endplate retropulsion.

Vertebrae: Chronic severe compression deformity L1. New severe
compression deformity of T12 since 07/05/2020 abdomen CT. Fluid
remains within the fracture cleft and there is adjacent marrow
edema. Height loss has increased since 07/09/2020. Chronic severe
compression deformity L4 with interval cement augmentation since
8878. Mild degenerative marrow edema at inferior endplates of T12
and L1.

Conus medullaris and cauda equina: Conus extends to the L1-L2 level.
Conus and cauda equina appear normal.

Paraspinal and other soft tissues: Cholelithiasis. Indeterminate
right adrenal nodule. Bilateral renal cysts.

Disc levels:

T11-T12: T12 superior endplate retropulsion. Facet arthropathy. Mild
canal stenosis. No foraminal stenosis.

T12-L1: L1 superior endplate retropulsion. Facet arthropathy. Minor
canal stenosis. No foraminal stenosis.

L1-L2: No canal or foraminal stenosis.

L2-L3: Disc bulge. Facet arthropathy. No canal or foraminal
stenosis.

L3-L4: Disc bulge. Facet arthropathy. Minimal canal stenosis. Slight
effacement of the subarticular recesses. Minimal foraminal stenosis.

L4-L5: Disc bulge. Facet arthropathy. No canal stenosis. Slight
effacement of the subarticular recesses. Mild foraminal stenosis.

L5-S1:  Facet arthropathy.  No canal or foraminal stenosis.
IMPRESSION: T12 compression fracture (present on 07/09/2020 with increased
height loss) with superior endplate retropulsion causing mild
stenosis. Chronic L1 and L4 compression fractures.

Multilevel degenerative changes without high-grade stenosis.

## 2021-04-05 ENCOUNTER — Other Ambulatory Visit: Payer: Self-pay

## 2021-04-05 ENCOUNTER — Ambulatory Visit: Payer: Medicare Other | Admitting: Family Medicine

## 2021-04-05 ENCOUNTER — Encounter: Payer: Self-pay | Admitting: Family Medicine

## 2021-04-05 VITALS — Ht 65.0 in | Wt 148.0 lb

## 2021-04-05 DIAGNOSIS — M8000XA Age-related osteoporosis with current pathological fracture, unspecified site, initial encounter for fracture: Secondary | ICD-10-CM

## 2021-04-05 DIAGNOSIS — S32030A Wedge compression fracture of third lumbar vertebra, initial encounter for closed fracture: Secondary | ICD-10-CM

## 2021-04-05 MED ORDER — CALCITONIN (SALMON) 200 UNIT/ACT NA SOLN: 1.0000 | Freq: Every day | NASAL | 11 refills | Status: AC

## 2021-04-05 NOTE — Progress Notes (Signed)
Deanna Gonzales - 87 y.o. female MRN [id number]  Date of birth: 1927-11-16  SUBJECTIVE:  Including CC & ROS.  No chief complaint on file.   Deanna Gonzales is a 87 y.o. female that is presenting with acute on chronic back pain.  She has been diagnosed with several compression fractures.  She has a long history of osteoporosis but unable to tolerate medications.  She has had kyphoplasty.  She continues to have pain despite taking pain medications.  Unable to tolerate much movement and is lying in bed most days..  Review of the MRI lumbar spine from January shows compression fracture of T12 and chronic L1 and L4 compression fractures.   Review of Systems See HPI   HISTORY: Past Medical, Surgical, Social, and Family History Reviewed & Updated per EMR.   Pertinent Historical Findings include:  Past Medical History:  Diagnosis Date   Age-related macular degeneration    Anxiety    Atrial fibrillation ([facility])    Basal cell carcinoma    "nose"   Chronic lower back pain    CKD (chronic kidney disease), stage III ([place])    [name] 09/18/2017   Depression    Family history of adverse reaction to anesthesia    "sister passed away when she was 30; from anesthesia" (09/18/2017)   GERD (gastroesophageal reflux disease)    History of blood transfusion    "related to OR"   Hypertension    Hypothyroidism    Lumbar compression fracture ([facility]) 09/06/2017   L4; Atraumatic/notes 09/18/2017   Osteoarthritis    "all over" (09/18/2017)   Osteoporosis    Osteoporosis    Presence of permanent cardiac pacemaker ?2012; ?2016   Thyroid disease     Past Surgical History:  Procedure Laterality Date   ABDOMINAL EXPLORATION SURGERY  ?1960   "benign tumor on intestines"   ABDOMINAL HYSTERECTOMY  1980   "partial"   APPENDECTOMY     ATRIAL ABLATION SURGERY     BASAL CELL CARCINOMA EXCISION     nose   CARDIAC CATHETERIZATION     CATARACT EXTRACTION W/ INTRAOCULAR LENS  IMPLANT, BILATERAL Bilateral    INSERT  / REPLACE / REMOVE PACEMAKER  ?2012; ?2016   KYPHOPLASTY N/A 09/25/2017   Procedure: KYPHOPLASTY LUMBAR FOUR;  Surgeon: [name], MD;  Location: [facility];  Service: Orthopedics;  Laterality: N/A;   PACEMAKER REMOVAL  ?2012   "1st one got infected; didn't have a pacemaker for 2-3 years"    Family History  Problem Relation Age of Onset   Anxiety disorder Mother    Heart disease Mother    Hypertension Father    Heart disease Father    Anxiety disorder Sister    Aneurysm Sister     Social History   Socioeconomic History   Marital status: Widowed    Spouse name: Not on file   Number of children: Not on file   Years of education: Not on file   Highest education level: Not on file  Occupational History   Not on file  Tobacco Use   Smoking status: Never   Smokeless tobacco: Never  Vaping Use   Vaping Use: Never used  Substance and Sexual Activity   Alcohol use: No   Drug use: No   Sexual activity: Not on file  Other Topics Concern   Not on file  Social History Narrative   Not on file   Social Determinants of Health   Financial Resource Strain: Not on file  Food Insecurity:  Not on file  Transportation Needs: Not on file  Physical Activity: Not on file  Stress: Not on file  Social Connections: Not on file  Intimate Partner Violence: Not on file     PHYSICAL EXAM:  VS: Ht '5\' 5"'$  (1.651 m)   Wt 148 lb (67.1 kg)   BMI 24.63 kg/m  Physical Exam Gen: NAD, alert, cooperative with exam, well-appearing     ASSESSMENT & PLAN:   Lumbar compression fracture ([facility]CC) Acutely occurring and significant pain. -Counseled on supportive care. -Counseled on back brace. -Calcitonin nasal spray.  Age-related osteoporosis with current pathological fracture Has had multiple compression fractures and unable to tolerate previous oral medications for osteoporosis.  In significant pain due to compression fractures. -Counseled on supportive care. -Pursue Evenity. - check vitamin D  and get updated bone density at some point.

## 2021-04-05 NOTE — Assessment & Plan Note (Signed)
Acutely occurring and significant pain. -Counseled on supportive care. -Counseled on back brace. -Calcitonin nasal spray.

## 2021-04-05 NOTE — Assessment & Plan Note (Signed)
Has had multiple compression fractures and unable to tolerate previous oral medications for osteoporosis.  In significant pain due to compression fractures. -Counseled on supportive care. -Pursue Evenity. - check vitamin D and get updated bone density at some point.

## 2021-04-05 NOTE — Patient Instructions (Signed)
Nice to meet you Please try heat  Please try the nasal spray  We'll update you about the evenity   Please send me a message in MyChart with any questions or updates.  We'll call you with the benefits.   --Dr. Raeford Razor

## 2021-04-10 ENCOUNTER — Emergency Department (HOSPITAL_COMMUNITY)
Admission: EM | Admit: 2021-04-10 | Discharge: 2021-04-10 | Disposition: A | Discharge status: Home / Self Care | Payer: Medicare Other | Attending: Emergency Medicine | Admitting: Emergency Medicine

## 2021-04-10 ENCOUNTER — Other Ambulatory Visit: Payer: Self-pay

## 2021-04-10 ENCOUNTER — Encounter (HOSPITAL_COMMUNITY): Payer: Self-pay

## 2021-04-10 ENCOUNTER — Emergency Department (HOSPITAL_COMMUNITY): Payer: Medicare Other

## 2021-04-10 DIAGNOSIS — Z7902 Long term (current) use of antithrombotics/antiplatelets: Secondary | ICD-10-CM | POA: Insufficient documentation

## 2021-04-10 DIAGNOSIS — Z79899 Other long term (current) drug therapy: Secondary | ICD-10-CM | POA: Insufficient documentation

## 2021-04-10 DIAGNOSIS — Z950 Presence of cardiac pacemaker: Secondary | ICD-10-CM | POA: Insufficient documentation

## 2021-04-10 DIAGNOSIS — I4891 Unspecified atrial fibrillation: Secondary | ICD-10-CM | POA: Insufficient documentation

## 2021-04-10 DIAGNOSIS — E039 Hypothyroidism, unspecified: Secondary | ICD-10-CM | POA: Diagnosis not present

## 2021-04-10 DIAGNOSIS — M25552 Pain in left hip: Secondary | ICD-10-CM | POA: Insufficient documentation

## 2021-04-10 DIAGNOSIS — R1030 Lower abdominal pain, unspecified: Secondary | ICD-10-CM | POA: Diagnosis present

## 2021-04-10 DIAGNOSIS — I129 Hypertensive chronic kidney disease with stage 1 through stage 4 chronic kidney disease, or unspecified chronic kidney disease: Secondary | ICD-10-CM | POA: Diagnosis not present

## 2021-04-10 DIAGNOSIS — N183 Chronic kidney disease, stage 3 unspecified: Secondary | ICD-10-CM | POA: Diagnosis not present

## 2021-04-10 DIAGNOSIS — R1084 Generalized abdominal pain: Secondary | ICD-10-CM | POA: Diagnosis not present

## 2021-04-10 DIAGNOSIS — M25551 Pain in right hip: Secondary | ICD-10-CM | POA: Diagnosis not present

## 2021-04-10 LAB — COMPREHENSIVE METABOLIC PANEL
ALT: 15 U/L (ref 0–44)
AST: 17 U/L (ref 15–41)
Albumin: 3.6 g/dL (ref 3.5–5.0)
Alkaline Phosphatase: 100 U/L (ref 38–126)
Anion gap: 8 (ref 5–15)
BUN: 20 mg/dL (ref 8–23)
CO2: 27 mmol/L (ref 22–32)
Calcium: 9.7 mg/dL (ref 8.9–10.3)
Chloride: 98 mmol/L (ref 98–111)
Creatinine, Ser: 1.21 mg/dL — ABNORMAL HIGH (ref 0.44–1.00)
GFR, Estimated: 42 mL/min — ABNORMAL LOW (ref >60)
Glucose, Bld: 100 mg/dL — ABNORMAL HIGH (ref 70–99)
Potassium: 4.2 mmol/L (ref 3.5–5.1)
Sodium: 133 mmol/L — ABNORMAL LOW (ref 135–145)
Total Bilirubin: 1.4 mg/dL — ABNORMAL HIGH (ref 0.3–1.2)
Total Protein: 7.5 g/dL (ref 6.5–8.1)

## 2021-04-10 LAB — CBC WITH DIFFERENTIAL/PLATELET
Abs Immature Granulocytes: 0.05 10*3/uL (ref 0.00–0.07)
Basophils Absolute: 0 10*3/uL (ref 0.0–0.1)
Basophils Relative: 0 %
Eosinophils Absolute: 0.1 10*3/uL (ref 0.0–0.5)
Eosinophils Relative: 1 %
HCT: 42.7 % (ref 36.0–46.0)
Hemoglobin: 13.9 g/dL (ref 12.0–15.0)
Immature Granulocytes: 1 %
Lymphocytes Relative: 11 %
Lymphs Abs: 0.7 10*3/uL (ref 0.7–4.0)
MCH: 32.3 pg (ref 26.0–34.0)
MCHC: 32.6 g/dL (ref 30.0–36.0)
MCV: 99.1 fL (ref 80.0–100.0)
Monocytes Absolute: 0.7 10*3/uL (ref 0.1–1.0)
Monocytes Relative: 10 %
Neutro Abs: 5.4 10*3/uL (ref 1.7–7.7)
Neutrophils Relative %: 77 %
Platelets: 245 10*3/uL (ref 150–400)
RBC: 4.31 MIL/uL (ref 3.87–5.11)
RDW: 14.2 % (ref 11.5–15.5)
WBC: 7 10*3/uL (ref 4.0–10.5)
nRBC: 0 % (ref 0.0–0.2)

## 2021-04-10 MED ORDER — MORPHINE SULFATE (PF) 2 MG/ML IV SOLN
2.0000 mg | Freq: Once | INTRAVENOUS | Status: AC
  Administered 2021-04-10: 2 mg via INTRAVENOUS
  Filled 2021-04-10: qty 1

## 2021-04-10 MED ORDER — IOHEXOL 350 MG/ML SOLN
75.0000 mL | Freq: Once | INTRAVENOUS | Status: AC | PRN
  Administered 2021-04-10: 75 mL via INTRAVENOUS

## 2021-04-10 MED ORDER — MORPHINE SULFATE (PF) 4 MG/ML IV SOLN
4.0000 mg | Freq: Once | INTRAVENOUS | Status: AC
Start: 2021-04-10 — End: 2021-04-10
  Administered 2021-04-10: 4 mg via INTRAVENOUS
  Filled 2021-04-10: qty 1

## 2021-04-10 NOTE — ED Notes (Signed)
Deanna Gonzales son [phone]

## 2021-04-10 NOTE — ED Notes (Signed)
Patient transported to CT 

## 2021-04-10 NOTE — Discharge Instructions (Addendum)
X-rays and imaging of your hip and abdomen did not show any fracture or other acute issue.  You may benefit from following up with a pain specialist.  Continued use of the narcotic medicine at home may cause constipation and abdominal pain.  Return to the ER if you have vomiting fevers worsening symptoms or any additional concerns otherwise follow-up with your primary care doctor within the week and pain specialist.

## 2021-04-10 NOTE — ED Provider Notes (Signed)
[facility] Provider Note   CSN: [id number] Arrival date & time: 04/10/21  0801     History Chief Complaint  Patient presents with   Hip Pain    Deanna Gonzales is a 87 y.o. female.  Patient presents with complaint of bilateral hip pain right greater than left.  She states that she has had hip pain for some time but felt worse today.  Also complaining of lower abdominal cramping sensation and pain.  This started today.  Denies any fevers or cough or vomiting or diarrhea.  Denies any fall or trauma.  There is report of known lumbar fracture several months ago which she has been managing at home with pain medications.      Past Medical History:  Diagnosis Date   Age-related macular degeneration    Anxiety    Atrial fibrillation (HCC)    Basal cell carcinoma    "nose"   Chronic lower back pain    CKD (chronic kidney disease), stage III ([place])    [name] 09/18/2017   Depression    Family history of adverse reaction to anesthesia    "sister passed away when she was 3; from anesthesia" (09/18/2017)   GERD (gastroesophageal reflux disease)    History of blood transfusion    "related to OR"   Hypertension    Hypothyroidism    Lumbar compression fracture ([place]) 09/06/2017   L4; Atraumatic/notes 09/18/2017   Osteoarthritis    "all over" (09/18/2017)   Osteoporosis    Osteoporosis    Presence of permanent cardiac pacemaker ?2012; ?2016   Thyroid disease     Patient Active Problem List   Diagnosis Date Noted   Age-related osteoporosis with current pathological fracture 04/05/2021   Lumbar compression fracture ([place]) 09/18/2017   Atrial fibrillation ([place]) 09/18/2017   Anxiety 09/18/2017   Acute midline low back pain     Past Surgical History:  Procedure Laterality Date   ABDOMINAL EXPLORATION SURGERY  ?1960   "benign tumor on intestines"   ABDOMINAL HYSTERECTOMY  1980   "partial"   APPENDECTOMY     ATRIAL ABLATION SURGERY     BASAL  CELL CARCINOMA EXCISION     nose   CARDIAC CATHETERIZATION     CATARACT EXTRACTION W/ INTRAOCULAR LENS  IMPLANT, BILATERAL Bilateral    INSERT / REPLACE / REMOVE PACEMAKER  ?2012; ?2016   KYPHOPLASTY N/A 09/25/2017   Procedure: KYPHOPLASTY LUMBAR FOUR;  Surgeon: [name], MD;  Location: [facility];  Service: Orthopedics;  Laterality: N/A;   PACEMAKER REMOVAL  ?2012   "1st one got infected; didn't have a pacemaker for 2-3 years"     OB History   No obstetric history on file.     Family History  Problem Relation Age of Onset   Anxiety disorder Mother    Heart disease Mother    Hypertension Father    Heart disease Father    Anxiety disorder Sister    Aneurysm Sister     Social History   Tobacco Use   Smoking status: Never   Smokeless tobacco: Never  Vaping Use   Vaping Use: Never used  Substance Use Topics   Alcohol use: No   Drug use: No    Home Medications Prior to Admission medications   Medication Sig Start Date End Date Taking? Authorizing Provider  ALPRAZolam (XANAX) 0.25 MG tablet Take 0.25 mg by mouth at bedtime. 08/21/17   [provider]  amiodarone (PACERONE) 100 MG tablet Take  200 mg by mouth daily.     [provider]  calcitonin, salmon, (MIACALCIN) 200 UNIT/ACT nasal spray Place 1 spray into alternate nostrils daily. 04/05/21   Rosemarie Ax, MD  cholecalciferol 1000 units tablet Take 1 tablet (1,000 Units total) by mouth daily. 09/27/17   Caroline More, DO  diltiazem (CARDIZEM) 120 MG tablet Take 120 mg by mouth daily. 06/26/17   [provider]  gabapentin (NEURONTIN) 100 MG capsule Take 200 mg by mouth 2 (two) times daily.     [provider]  HYDROcodone-acetaminophen (NORCO/VICODIN) 5-325 MG tablet Take 1 tablet by mouth every 6 (six) hours as needed for moderate pain. for pain 07/04/20   [provider]  levothyroxine (SYNTHROID, LEVOTHROID) 100 MCG tablet Take 100 mcg by mouth daily. 07/02/17    [provider]  linaclotide Rolan Lipa) 145 MCG CAPS capsule Take by mouth. 07/21/20   [provider]  losartan (COZAAR) 25 MG tablet Take 25 mg by mouth daily. 06/26/17   [provider]  RESTASIS 0.05 % ophthalmic emulsion Place 1 drop into both eyes 2 (two) times daily. 06/06/20   [provider]  triamterene-hydrochlorothiazide (MAXZIDE-25) 37.5-25 MG tablet Take 1 tablet by mouth every other day. Patient taking differently: Take 1 tablet by mouth daily. 09/28/17   Caroline More, DO  warfarin (COUMADIN) 3 MG tablet Take 3 mg by mouth daily.  06/18/17   [provider]  calcium carbonate (OS-CAL - DOSED IN MG OF ELEMENTAL CALCIUM) 1250 (500 Ca) MG tablet Take 1 tablet (500 mg of elemental calcium total) by mouth daily with breakfast. Patient not taking: Reported on 07/06/2020 09/27/17 07/06/20  Caroline More, DO    Allergies    Flexeril [cyclobenzaprine], Vortioxetine, Duloxetine, Risedronate sodium, Lisinopril, Adhesive [tape], and Alendronate  Review of Systems   Review of Systems  Constitutional:  Negative for fever.  HENT:  Negative for ear pain.   Eyes:  Negative for pain.  Respiratory:  Negative for cough.   Cardiovascular:  Negative for chest pain.  Gastrointestinal:  Positive for abdominal pain.  Genitourinary:  Negative for flank pain.  Musculoskeletal:  Positive for back pain.  Skin:  Negative for rash.  Neurological:  Negative for headaches.   Physical Exam Updated Vital Signs BP 139/73   Pulse 72   Temp 97.7 F (36.5 C) (Oral)   Resp 13   Ht '5\' 5"'$  (1.651 m)   Wt 67.1 kg   SpO2 94%   BMI 24.62 kg/m   Physical Exam Constitutional:      General: She is not in acute distress.    Appearance: Normal appearance.  HENT:     Head: Normocephalic.     Nose: Nose normal.  Eyes:     Extraocular Movements: Extraocular movements intact.  Cardiovascular:     Rate and Rhythm: Normal rate.  Pulmonary:     Effort: Pulmonary  effort is normal.  Musculoskeletal:     Cervical back: Normal range of motion.     Comments: Pain and crepitus noted with range of motion of the right hip.  No pain with knee or ankle palpation and range of motion.  No C or T or L-spine midline step-offs noted.  Tenderness noted.  Neurological:     General: No focal deficit present.     Mental Status: She is alert. Mental status is at baseline.    ED Results / Procedures / Treatments   Labs (all labs ordered are listed, but only abnormal results  are displayed) Labs Reviewed  COMPREHENSIVE METABOLIC PANEL - Abnormal; Notable for the following components:      Result Value   Sodium 133 (*)    Glucose, Bld 100 (*)    Creatinine, Ser 1.21 (*)    Total Bilirubin 1.4 (*)    GFR, Estimated 42 (*)    All other components within normal limits  CBC WITH DIFFERENTIAL/PLATELET  URINALYSIS, ROUTINE W REFLEX MICROSCOPIC    EKG None  Radiology CT Abdomen Pelvis W Contrast  Result Date: 04/10/2021 CLINICAL DATA:  Acute abdominal pain and right hip pain EXAM: CT ABDOMEN AND PELVIS WITH CONTRAST TECHNIQUE: Multidetector CT imaging of the abdomen and pelvis was performed using the standard protocol following bolus administration of intravenous contrast. CONTRAST:  86m OMNIPAQUE IOHEXOL 350 MG/ML SOLN COMPARISON:  07/09/2020 FINDINGS: Lower chest: Chronic appearing atelectasis versus scarring. Rounded type atelectasis in the right lower lobe noted posteriorly. No pericardial or pleural effusion. Normal heart size. Pacer wires in the right heart. Lower thoracic aorta is atherosclerotic and tortuous. Hepatobiliary: Stable scattered hepatic cysts as before. No new focal hepatic abnormality. Biliary tree nondilated. Moderate gallbladder distension. Suspect underlying noncalcified gallstones. No wall thickening or surrounding inflammatory process. Common bile duct nondilated. Pancreas: Some degree of pancreatic atrophy. No acute finding or surrounding  inflammatory process. No definite focal abnormality. Spleen: Normal in size without focal abnormality. Adrenals/Urinary Tract: Normal left adrenal gland. Stable 1.8 cm right adrenal adenoma. Similar cortical and parapelvic renal cysts. No renal obstruction or hydronephrosis. No hydroureter or ureteral calculus. Moderate distension of the bladder without wall thickening or focal abnormality. Stomach/Bowel: Negative for bowel obstruction, significant dilatation, ileus, or free air. Appendix not visualized. No acute inflammatory process in the right lower quadrant. No free fluid, fluid collection, hemorrhage, hematoma, abscess or ascites. Vascular/Lymphatic: Aortoiliac atherosclerosis slight tortuosity. Negative for aneurysm or dissection. No retroperitoneal hemorrhage or hematoma. Calcifications of the mesenteric and renal vasculature but no occlusive process. No veno-occlusive process. No adenopathy. Reproductive: Status post hysterectomy. No adnexal masses. Other: No abdominal wall hernia or abnormality. No abdominopelvic ascites. Musculoskeletal: Bones are osteopenic. Previous L4 kyphoplasty. Interval compression fractures at T12 and L3 having more of an acute to subacute appearance. Chronic appearing L1 compression fracture. IMPRESSION: No acute intra-abdominal or pelvic finding by contrast CT. Similar gallbladder distension and suspect noncalcified gallstones but no surrounding inflammatory process or wall thickening to suggest acute cholecystitis. Interval compression fractures at T12 and L3 appearing subacute and new since prior exam. Chronic L1 compression fracture. Aortic Atherosclerosis (ICD10-I70.0). Electronically Signed   By: MJerilynn Mages  Shick M.D.   On: 04/10/2021 10:42   DG Hip Unilat With Pelvis 2-3 Views Right  Result Date: 04/10/2021 CLINICAL DATA:  Right hip pain, no reported injury EXAM: DG HIP (WITH OR WITHOUT PELVIS) 2-3V RIGHT COMPARISON:  None. FINDINGS: No right hip fracture or dislocation. No  pelvic fracture or diastasis. Vertebroplasty material within L4 vertebral compression fracture. Mild bilateral hip osteoarthritis. No suspicious focal osseous lesions. IMPRESSION: No acute osseous abnormality. Mild bilateral hip osteoarthritis. Electronically Signed   By: JIlona SorrelM.D.   On: 04/10/2021 09:10    Procedures Procedures   Medications Ordered in ED Medications  morphine 2 MG/ML injection 2 mg (2 mg Intravenous Given 04/10/21 0857)  morphine 4 MG/ML injection 4 mg (4 mg Intravenous Given 04/10/21 0951)  iohexol (OMNIPAQUE) 350 MG/ML injection 75 mL (75 mLs Intravenous Contrast Given 04/10/21 1009)    ED Course  I have reviewed the triage vital signs  and the nursing notes.  Pertinent labs & imaging results that were available during my care of the patient were reviewed by me and considered in my medical decision making (see chart for details).    MDM Rules/Calculators/A&P                           Labs within normal limits white count normal chemistry normal.  Ancillary imaging study pursued.  Patient given morphine for pain management.  X-rays of the hip showed no acute fracture, osteoarthritis noted.  Labs are otherwise unremarkable CT imaging shows no acute findings.  Patient appears to have acute exacerbation of her chronic hip pain.  Spoke at length with son, recommending outpatient pain management follow-up.  Advised decreasing hydrocodone at home as it may cause further GI issues and constipation.  Advised immediate return for fevers worsening symptoms or any additional concerns.   Final Clinical Impression(s) / ED Diagnoses Final diagnoses:  Right hip pain  Generalized abdominal pain    Rx / DC Orders ED Discharge Orders     None        Luna Fuse, MD 04/10/21 1112

## 2021-04-10 NOTE — ED Triage Notes (Signed)
Pt arrived via [facility] from home for c/o bilat hip pain that started last night. Pt denies fall or injury. Per EMS not injury or deformity noted to hips. Per EMS pt used walker and took a couple steps to their stretcher. Per EMS pt sleeps in recliner and has been doing so since non traumatic fx to L3 back in Jan. Pt is A&Ox4.

## 2021-04-10 NOTE — ED Notes (Signed)
Patient transported to X-ray 

## 2021-04-12 ENCOUNTER — Ambulatory Visit: Payer: Medicare Other | Admitting: Family Medicine

## 2021-05-16 ENCOUNTER — Telehealth: Payer: Self-pay | Admitting: Family Medicine

## 2021-05-16 NOTE — Telephone Encounter (Signed)
Pt's daughter [name] called regarding update on Evenity injection .  --Forwarding message to med asst for review.  Pls call  daughter/Tammy N w/ update [phone]  --glh

## 2021-05-16 NOTE — Telephone Encounter (Signed)
Staff message sent to Brett Albino., CMA to initiate benefit verification. It doesn't seem to have been verified previously.

## 2021-05-26 ENCOUNTER — Telehealth: Payer: Self-pay | Admitting: Family Medicine

## 2021-05-26 NOTE — Telephone Encounter (Signed)
Pt's daughter/ Tammy Nifong cld to check status of Evenity approval ---advised waiting on Evenity rep to respond  & will call her back as soon as we hear any update.  --glh

## 2021-05-29 ENCOUNTER — Telehealth: Payer: Self-pay

## 2021-05-29 NOTE — Telephone Encounter (Signed)
Evenity VOB initiated via [email or web address]  Last OV:  Next OV:  Last Evenity inj:  Next Evenity inj DUE:

## 2021-05-31 NOTE — Telephone Encounter (Signed)
Received patient's SOB for Evenity. PA is pending. Deanna Gonzales C.CMA is working on [place]. She states we should hear back from her on 06/01/21. I called pt's daughter and left her a detailed message informing her we are still waiting to hear back from insurance regarding the PA.

## 2021-06-01 NOTE — Telephone Encounter (Signed)
Patient's daughter informed of SOB breakdown for Evenity. Nurse visit scheduled 06/06/21.  Evenity ordered with [facility] pharmacy.

## 2021-06-01 NOTE — Telephone Encounter (Signed)
Patient's daughter informed of SOB breakdown for Evenity. Nurse visit scheduled 06/06/21.  Evenity ordered with [facility] pharmacy.

## 2021-06-01 NOTE — Telephone Encounter (Signed)
Pt ready for scheduling on or after 06/01/21  Out-of-pocket cost due at time of visit: $420  Primary: UHC Medicare Evenity co-insurance: 20% Admin fee co-insurance: $30  Secondary: [place] Medicare Supp Evenity co-insurance: no coverage Admin fee co-insurance:   Deductible: does not apply  Prior Auth: APPROVED PA# [id number] Valid: 06/01/21-06/01/22   ** This summary of benefits is an estimation of the patient's out-of-pocket cost. Exact cost may vary based on individual plan coverage.

## 2021-06-02 ENCOUNTER — Telehealth: Payer: Self-pay | Admitting: Family Medicine

## 2021-06-02 NOTE — Telephone Encounter (Signed)
Crist Fat, CMA; Rosemarie Ax, MD  Ms. Niflong /pt's daughter called states she(patient) has been reading material given regarding the  Evenity and decided not to take it for right now.   Patient apologizes for have it ordered  1st & has since changed her mind

## 2021-06-02 NOTE — Telephone Encounter (Signed)
Spoke with patient about evenity.   Rosemarie Ax, MD [facility] 06/02/2021, 1:16 PM

## 2021-06-06 ENCOUNTER — Ambulatory Visit: Payer: Medicare Other

## 2021-06-08 ENCOUNTER — Ambulatory Visit (INDEPENDENT_AMBULATORY_CARE_PROVIDER_SITE_OTHER): Payer: Medicare Other | Admitting: Family Medicine

## 2021-06-08 ENCOUNTER — Ambulatory Visit: Payer: Medicare Other | Admitting: Family Medicine

## 2021-06-08 ENCOUNTER — Encounter: Payer: Self-pay | Admitting: Family Medicine

## 2021-06-08 VITALS — BP 128/80 | Ht 65.0 in | Wt 139.2 lb

## 2021-06-08 DIAGNOSIS — M8000XG Age-related osteoporosis with current pathological fracture, unspecified site, subsequent encounter for fracture with delayed healing: Secondary | ICD-10-CM | POA: Diagnosis not present

## 2021-06-08 NOTE — Patient Instructions (Signed)
Good to see you I will call with the results from today   Please send me a message in MyChart with any questions or updates.  Please see me back in 1 month.   --Dr. Raeford Razor

## 2021-06-08 NOTE — Progress Notes (Signed)
Deanna Gonzales - 87 y.o. female MRN [id number]  Date of birth: [birth year]  SUBJECTIVE:  Including CC & ROS.  No chief complaint on file.   Deanna Gonzales is a 87 y.o. female that is going to discuss her osteoporosis treatment.  She had concerns about the safety of evenity.    Review of Systems See HPI   HISTORY: Past Medical, Surgical, Social, and Family History Reviewed & Updated per EMR.   Pertinent Historical Findings include:  Past Medical History:  Diagnosis Date   Age-related macular degeneration    Anxiety    Atrial fibrillation (HCC)    Basal cell carcinoma    "nose"   Chronic lower back pain    CKD (chronic kidney disease), stage III ([place])    [name] 09/18/2017   Depression    Family history of adverse reaction to anesthesia    "sister passed away when she was 71; from anesthesia" (09/18/2017)   GERD (gastroesophageal reflux disease)    History of blood transfusion    "related to OR"   Hypertension    Hypothyroidism    Lumbar compression fracture ([place]) 09/06/2017   L4; Atraumatic/notes 09/18/2017   Osteoarthritis    "all over" (09/18/2017)   Osteoporosis    Osteoporosis    Presence of permanent cardiac pacemaker ?2012; ?2016   Thyroid disease     Past Surgical History:  Procedure Laterality Date   ABDOMINAL EXPLORATION SURGERY  ?1960   "benign tumor on intestines"   ABDOMINAL HYSTERECTOMY  1980   "partial"   APPENDECTOMY     ATRIAL ABLATION SURGERY     BASAL CELL CARCINOMA EXCISION     nose   CARDIAC CATHETERIZATION     CATARACT EXTRACTION W/ INTRAOCULAR LENS  IMPLANT, BILATERAL Bilateral    INSERT / REPLACE / REMOVE PACEMAKER  ?2012; ?2016   KYPHOPLASTY N/A 09/25/2017   Procedure: KYPHOPLASTY LUMBAR FOUR;  Surgeon: [name], MD;  Location: [facility];  Service: Orthopedics;  Laterality: N/A;   PACEMAKER REMOVAL  ?2012   "1st one got infected; didn't have a pacemaker for 2-3 years"    Family History  Problem Relation Age of Onset   Anxiety disorder  Mother    Heart disease Mother    Hypertension Father    Heart disease Father    Anxiety disorder Sister    Aneurysm Sister     Social History   Socioeconomic History   Marital status: Widowed    Spouse name: Not on file   Number of children: Not on file   Years of education: Not on file   Highest education level: Not on file  Occupational History   Not on file  Tobacco Use   Smoking status: Never   Smokeless tobacco: Never  Vaping Use   Vaping Use: Never used  Substance and Sexual Activity   Alcohol use: No   Drug use: No   Sexual activity: Not on file  Other Topics Concern   Not on file  Social History Narrative   Not on file   Social Determinants of Health   Financial Resource Strain: Not on file  Food Insecurity: Not on file  Transportation Needs: Not on file  Physical Activity: Not on file  Stress: Not on file  Social Connections: Not on file  Intimate Partner Violence: Not on file     PHYSICAL EXAM:  VS: BP 128/80 (BP Location: Left Arm, Patient Position: Sitting)   Ht 5\' 5"  (1.651 m)   Wt 139 lb  4 oz (63.2 kg)   BMI 23.17 kg/m  Physical Exam Gen: NAD, alert, cooperative with exam, well-appearing      ASSESSMENT & PLAN:   Age-related osteoporosis with current pathological fracture Has had repeated compression fractures with kyphoplasty.  Is unable to lay flat without significant pain.  Has a sleep sitting up in a recliner. -Pursue Evenity -Check vitamin D today.

## 2021-06-08 NOTE — Assessment & Plan Note (Signed)
Has had repeated compression fractures with kyphoplasty.  Is unable to lay flat without significant pain.  Has a sleep sitting up in a recliner. -Pursue Evenity -Check vitamin D today.

## 2021-06-08 NOTE — Telephone Encounter (Signed)
Pt archived in [email or web address].  Please advise if patient and/or provider wish to proceed with Evenity therpay.

## 2021-06-09 LAB — VITAMIN D 25 HYDROXY (VIT D DEFICIENCY, FRACTURES): Vit D, 25-Hydroxy: 115.6 ng/mL — ABNORMAL HIGH (ref 30.0–100.0)

## 2021-06-12 ENCOUNTER — Telehealth: Payer: Self-pay | Admitting: Family Medicine

## 2021-06-12 NOTE — Telephone Encounter (Signed)
Informed of results. Will have her take vitamin D once weekly with it being elevated.   Rosemarie Ax, MD [facility] 06/12/2021, 8:46 AM

## 2021-07-11 ENCOUNTER — Ambulatory Visit (INDEPENDENT_AMBULATORY_CARE_PROVIDER_SITE_OTHER): Payer: Medicare Other | Admitting: *Deleted

## 2021-07-11 DIAGNOSIS — M8000XG Age-related osteoporosis with current pathological fracture, unspecified site, subsequent encounter for fracture with delayed healing: Secondary | ICD-10-CM | POA: Diagnosis not present

## 2021-07-11 NOTE — Progress Notes (Signed)
Patient is here for nurse visit for her 2nd Evenity injection. She did state she had generalized body aches and both her arms were sore x 2 days after her initial injections on 06/08/21.  Patient received bilateral [place] arm injections today. She will return in 1 month for her next Evenity injection or call us if she needs [place] sooner.

## 2021-08-15 ENCOUNTER — Ambulatory Visit: Payer: Medicare Other | Admitting: *Deleted

## 2021-08-15 ENCOUNTER — Encounter: Payer: Self-pay | Admitting: *Deleted

## 2021-08-15 DIAGNOSIS — M8000XG Age-related osteoporosis with current pathological fracture, unspecified site, subsequent encounter for fracture with delayed healing: Secondary | ICD-10-CM

## 2021-08-15 NOTE — Progress Notes (Signed)
Patient is here for nurse visit for her 3rd Evenity injection.  Patient received bilateral [place] arm injections today. She tolerated injections well. She will return in 1 month for her next Evenity injection.

## 2021-08-25 ENCOUNTER — Telehealth: Payer: Self-pay

## 2021-08-25 NOTE — Telephone Encounter (Signed)
Volunteer called patient/family on behalf of [facility] and did not get a answer from patient/family. ° °

## 2021-09-06 NOTE — Telephone Encounter (Signed)
Pt ready for scheduling on or after 09/06/21  Out-of-pocket cost due at time of visit: $438   Primary: UHC Medicare Evenity co-insurance: 20% ($418) Admin fee co-insurance: $20   Secondary: [place] Medicare Supp Evenity co-insurance: undisclosed Admin fee co-insurance: undisclosed   Deductible: does not apply   [facility] Medicare Prior Auth: APPROVED PA# [id number] Valid: 06/01/21-06/01/22  ** This summary of benefits is an estimation of the patient's out-of-pocket cost. Exact cost may vary based on individual plan coverage.

## 2021-09-11 NOTE — Telephone Encounter (Addendum)
Evenity Injection Schedule  Inj #1 - 06/08/21 Inj #2 - 07/11/21 Inj #3 - 08/15/21 Inj #4 -  09/19/21 Inj #5 - 10/19/21 Inj #6 -  11/21/21 Inj #7 - scheduled 12/22/21 Inj #8 - Inj #9 - Inj #10 - Inj #11 - renew prior auth, expires 06/01/22 Inj #12 -

## 2021-09-19 ENCOUNTER — Encounter: Payer: Self-pay | Admitting: *Deleted

## 2021-09-19 ENCOUNTER — Ambulatory Visit (INDEPENDENT_AMBULATORY_CARE_PROVIDER_SITE_OTHER): Payer: Medicare Other | Admitting: *Deleted

## 2021-09-19 DIAGNOSIS — M8000XG Age-related osteoporosis with current pathological fracture, unspecified site, subsequent encounter for fracture with delayed healing: Secondary | ICD-10-CM

## 2021-09-19 NOTE — Progress Notes (Signed)
Patient is here for nurse visit for her 4th Evenity injection.  Patient received bilateral [place] arm injections today. She tolerated injections well. She will return in 1 month for her next Evenity injection.

## 2021-10-19 ENCOUNTER — Ambulatory Visit (INDEPENDENT_AMBULATORY_CARE_PROVIDER_SITE_OTHER): Payer: Medicare Other | Admitting: *Deleted

## 2021-10-19 ENCOUNTER — Encounter: Payer: Self-pay | Admitting: *Deleted

## 2021-10-19 DIAGNOSIS — M8000XG Age-related osteoporosis with current pathological fracture, unspecified site, subsequent encounter for fracture with delayed healing: Secondary | ICD-10-CM | POA: Diagnosis not present

## 2021-10-19 NOTE — Progress Notes (Signed)
Patient is here for nurse visit for her 5th Evenity injection.  ?Patient received bilateral [place] arm injections today. She tolerated injections well. She will return in 1 month for her next Evenity injection.  ?

## 2021-11-21 ENCOUNTER — Ambulatory Visit (INDEPENDENT_AMBULATORY_CARE_PROVIDER_SITE_OTHER): Payer: Medicare Other | Admitting: *Deleted

## 2021-11-21 DIAGNOSIS — M8000XG Age-related osteoporosis with current pathological fracture, unspecified site, subsequent encounter for fracture with delayed healing: Secondary | ICD-10-CM

## 2021-11-21 NOTE — Progress Notes (Signed)
?  Patient is here for nurse visit for her 6th Evenity injection.  ?Patient received bilateral [place] arm injections today. She tolerated injections well. She will return in 1 month for her next Evenity injection.  ?

## 2021-12-19 ENCOUNTER — Telehealth: Payer: Self-pay | Admitting: Family Medicine

## 2021-12-19 NOTE — Telephone Encounter (Signed)
Patient's daughter called says pt has had a medical emergency/Stroke and  to cancel Evenity injection appt.  ?-- They will call back after consulting w/PCP if future injections advised. ? ?-glh ?

## 2021-12-22 ENCOUNTER — Ambulatory Visit: Payer: Medicare Other

## 2021-12-23 NOTE — Telephone Encounter (Signed)
Patient's daughter called says pt has had a medical emergency/Stroke and  to cancel Evenity injection appt.  -- They will call back after consulting w/PCP if future injections advised.   -glh

## 2022-08-06 DEATH — deceased
